# Patient Record
Sex: Female | Born: 1935 | Race: Black or African American | Hispanic: No | Smoking: Never smoker
Health system: Southern US, Community
[De-identification: ages and names within clinical notes are randomized; demographics above are authoritative.]

## PROBLEM LIST (undated history)

## (undated) DIAGNOSIS — H9190 Unspecified hearing loss, unspecified ear: Secondary | ICD-10-CM

## (undated) DIAGNOSIS — F0390 Unspecified dementia without behavioral disturbance: Secondary | ICD-10-CM

## (undated) DIAGNOSIS — I10 Essential (primary) hypertension: Secondary | ICD-10-CM

## (undated) DIAGNOSIS — D649 Anemia, unspecified: Secondary | ICD-10-CM

## (undated) DIAGNOSIS — M6281 Muscle weakness (generalized): Secondary | ICD-10-CM

## (undated) HISTORY — PX: HIP FRACTURE SURGERY: SHX118

---

## 1997-08-08 ENCOUNTER — Other Ambulatory Visit: Admission: RE | Admit: 1997-08-08 | Discharge: 1997-08-08 | Payer: Self-pay | Admitting: Internal Medicine

## 1997-11-01 ENCOUNTER — Other Ambulatory Visit: Admission: RE | Admit: 1997-11-01 | Discharge: 1997-11-01 | Payer: Self-pay | Admitting: Internal Medicine

## 2000-04-14 ENCOUNTER — Encounter: Payer: Self-pay | Admitting: Internal Medicine

## 2000-04-14 ENCOUNTER — Ambulatory Visit (HOSPITAL_COMMUNITY): Admission: RE | Admit: 2000-04-14 | Discharge: 2000-04-14 | Payer: Self-pay | Admitting: Internal Medicine

## 2006-01-16 ENCOUNTER — Encounter: Payer: Self-pay | Admitting: Vascular Surgery

## 2006-01-16 ENCOUNTER — Ambulatory Visit (HOSPITAL_COMMUNITY): Admission: RE | Admit: 2006-01-16 | Discharge: 2006-01-16 | Payer: Self-pay | Admitting: Internal Medicine

## 2006-03-26 ENCOUNTER — Emergency Department (HOSPITAL_COMMUNITY): Admission: EM | Admit: 2006-03-26 | Discharge: 2006-03-26 | Payer: Self-pay | Admitting: Emergency Medicine

## 2006-04-11 ENCOUNTER — Encounter: Payer: Self-pay | Admitting: Orthopedic Surgery

## 2006-04-11 ENCOUNTER — Inpatient Hospital Stay (HOSPITAL_COMMUNITY): Admission: EM | Admit: 2006-04-11 | Discharge: 2006-04-17 | Payer: Self-pay | Admitting: Emergency Medicine

## 2007-07-01 IMAGING — CR DG HIP 1V PORT*R*
1 series · 1 of 1 positions shown · non-contrast
Comparison: none

CLINICAL DATA: [AGE]; right hip fracture.
 PORTABLE RIGHT HIP ? 1 VIEW ? 04/12/06: 
 Single portable lateral view of the right hip.

[view not recorded]
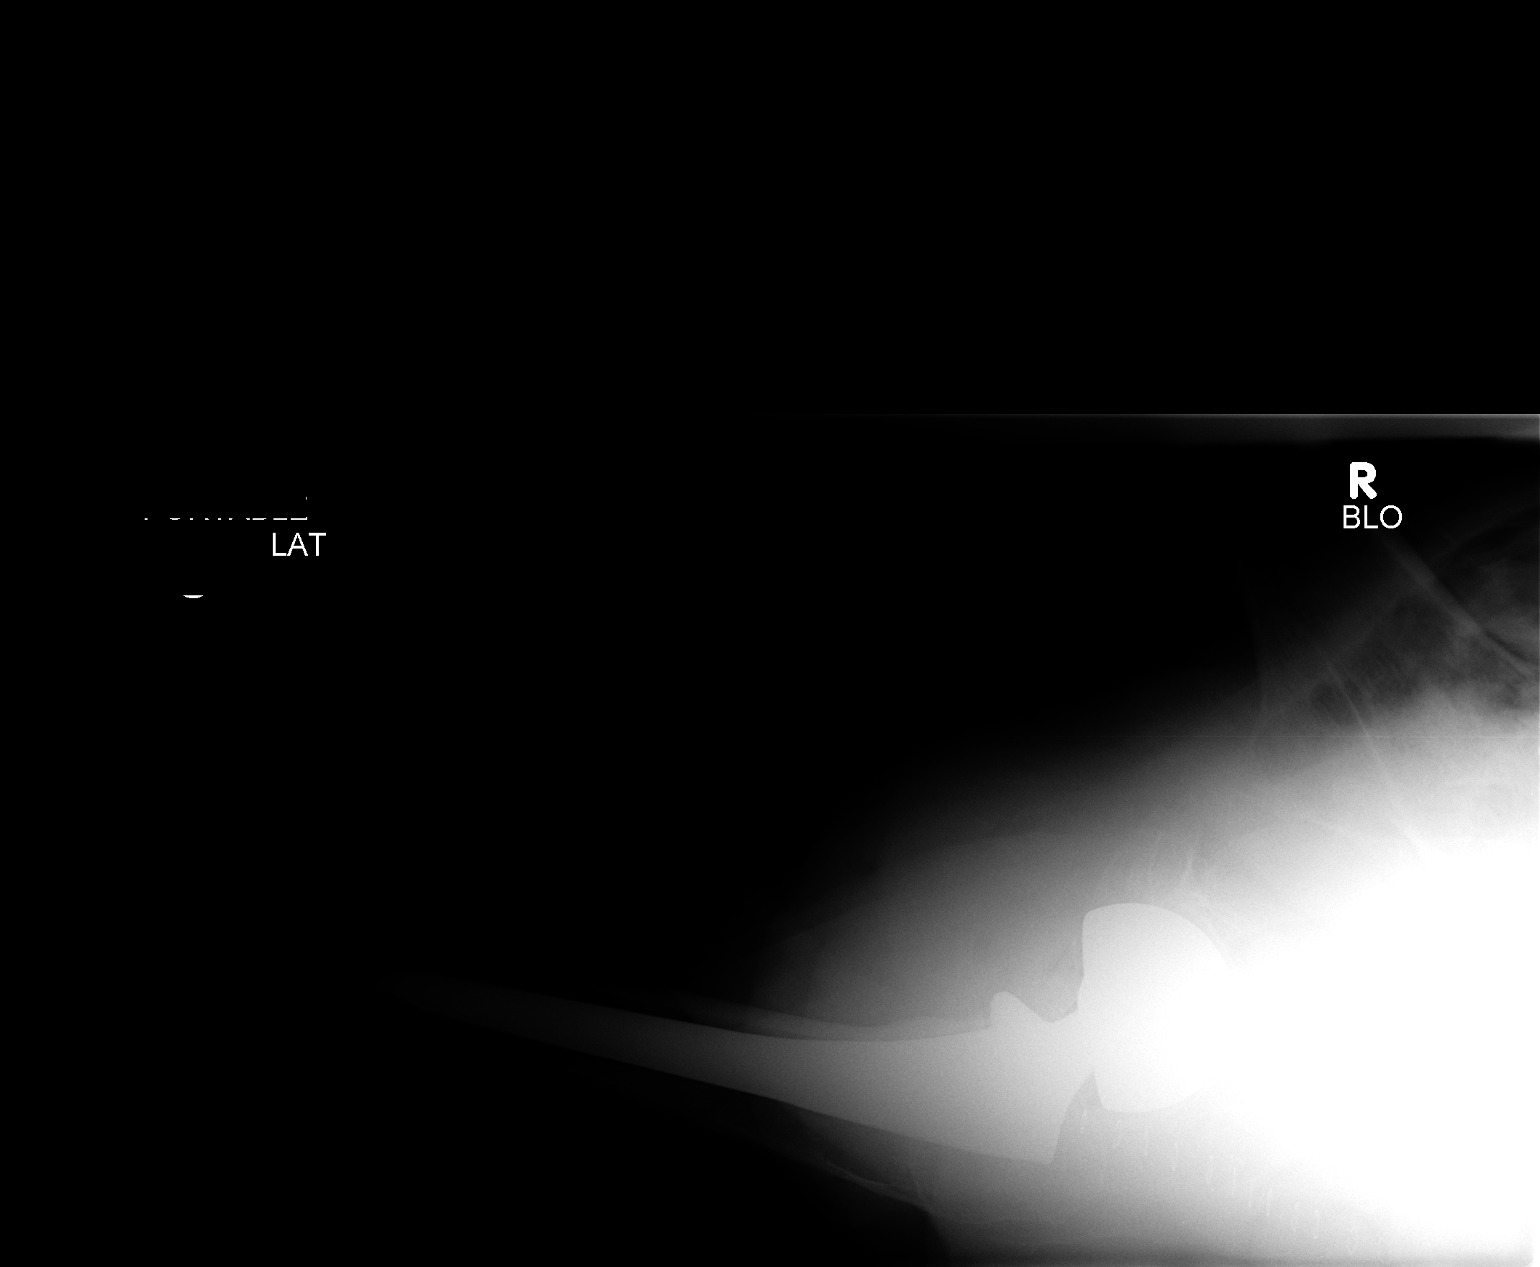

[1 of 1 positions shown; findings below may reference images not displayed]

FINDINGS: There is a bipolar hip prosthesis in place.  The study is limited but no gross complicating features are demonstrated.
IMPRESSION: Bipolar hip prosthesis in good position.  No definite complicating features but the exam is somewhat limited.

## 2008-09-25 ENCOUNTER — Emergency Department (HOSPITAL_COMMUNITY): Admission: EM | Admit: 2008-09-25 | Discharge: 2008-09-25 | Payer: Self-pay | Admitting: Emergency Medicine

## 2010-08-07 LAB — POCT I-STAT, CHEM 8
BUN: 16 mg/dL (ref 6–23)
Calcium, Ion: 1.15 mmol/L (ref 1.12–1.32)
Creatinine, Ser: 1.1 mg/dL (ref 0.4–1.2)
Hemoglobin: 11.6 g/dL — ABNORMAL LOW (ref 12.0–15.0)
Sodium: 143 mEq/L (ref 135–145)

## 2010-08-13 ENCOUNTER — Encounter: Payer: Self-pay | Admitting: Internal Medicine

## 2010-08-13 DIAGNOSIS — E119 Type 2 diabetes mellitus without complications: Secondary | ICD-10-CM | POA: Insufficient documentation

## 2010-08-13 DIAGNOSIS — I10 Essential (primary) hypertension: Secondary | ICD-10-CM

## 2010-09-11 NOTE — Consult Note (Signed)
NAME:  Kara, [name] NO.:  [id number]   MEDICAL RECORD NO.:  [id number]          PATIENT TYPE:  EMS   LOCATION:  MAJO                         FACILITY:  [facility]   PHYSICIAN:  Kara [name], M.D.    DATE OF BIRTH:  1905/09/06   DATE OF CONSULTATION:  09/25/2008  DATE OF DISCHARGE:                                 CONSULTATION   HISTORY:  The patient is seen at the request of Dr. Weldon [name], the ER  physician for a probable food impaction.  He has tried some glucagon.  She had been eating chicken earlier and it would not go down.  She has  been constantly spitting.  She has never had this problem before and no  other previous swallowing problems.   PAST MEDICAL HISTORY:  Pertinent for arthritis, diabetes with a history  of a hiatal hernia as well, and high blood pressure.   PAST SURGICAL HISTORY:  Hip fracture only.   FAMILY HISTORY:  Noncontributory.   DRUG ALLERGIES:  None.   MEDICATIONS:  At home include Nasonex, Lasix, Nadolol, potassium,  Diovan, and aspirin.   REVIEW OF SYSTEMS:  Negative except above.   PHYSICAL EXAMINATION:  VITAL SIGNS:  Other than spitting into the cup,  vital signs stable and afebrile.  GENERAL:  She answers all questions appropriately.  LUNGS:  Clear.  HEART:  Regular rate and rhythm.  ABDOMEN:  Soft and nontender.   ASSESSMENT:  Obvious food impaction.   PLAN:  The risks, benefits, and methods of endoscopy were discussed with  both, the patient and the son.  We will proceed ASAP, with further  workup and plans depending on her findings.           ______________________________  Kara [name], M.D.     MEM/MEDQ  D:  09/25/2008  T:  09/25/2008  Job:  [id number]   cc:   Kara Gens P. Kara Inches, MD

## 2010-09-11 NOTE — Op Note (Signed)
NAME:  Kara [name], Kara [name] NO.:  [id number]   MEDICAL RECORD NO.:  [id number]          PATIENT TYPE:  EMS   LOCATION:  MAJO                         FACILITY:  [facility]   PHYSICIAN:  Petra Kuba, M.D.    DATE OF BIRTH:  03-16-1906   DATE OF PROCEDURE:  09/25/2008  DATE OF DISCHARGE:                               OPERATIVE REPORT   PROCEDURE:  EGD with food removal.   INDICATIONS:  Patient with obvious food impaction.  Consent was signed  after risks, benefits, methods, options thoroughly discussed with both  her and her son.   MEDICINES USED:  Fentanyl 25 mcg, Versed 2 mg.   PROCEDURE IN DETAIL:  The video endoscope was inserted by direct vision.  Obvious food, mostly liquid and vegetable matter was seen in her of mid  esophagus.  She did have a little bit of inflammation.  There was no  motility in the esophagus and we could advance the scope around the food  into the distal esophagus, which did have a fibrous ring and with  minimal resistance able to advance into the stomach.  There was no food  obstructing the ring.  There was some fluid in the stomach, which was  suctioned.  We went ahead and withdrew back to the mid esophagus where  the food was and first using the [facility] retrieval net removed 1 large  piece of vegetable matter.  We then connected the Lucina Mellow net to function.  Once the food was grabbed in the [place] net both the [place] net and the  scope were removed and the piece was recovered.  We reinserted the  scope.  We grabbed another piece with the four-prong grabber and the  scope were removed.  We then elevated the head off her bed after we  reinserted the scope and washed the remaining part of the food and water  into the stomach.  We then advanced into the stomach and some of the  fluid was suctioned.  We then slowly withdrew to her esophagus.  There  was some spasm, but no signs of bleeding, significant trauma, or any  residual food.  We could not completely  evaluate her stomach.  The scope  was removed.  The patient tolerated the procedure well.  There was no  obvious immediate complication.   ENDOSCOPIC DIAGNOSES:  1. Obvious food in her esophagus, although able to advance the scope      easily around it and into her stomach.  Some of the fluid was      suctioned.  2. Food removed with the Lucina Mellow net one time and the four-prong grabber      one time.  3. Small hiatal hernia with a small fibrous ring and increased spasm      of the esophagus.  4. Stomach not well evaluated.  Some of the fluid suctioned at the      beginning and at the end of the procedure.  5. No other abnormality seen in exam to the mid stomach as above.   PLAN:  Clear liquids for 24 hours.  We will  give her 2 weeks of Prilosec  OTC.  I will be happy to see her back p.r.n. if swallowing problems  continue.           ______________________________  Petra Kuba, M.D.     MEM/MEDQ  D:  09/25/2008  T:  09/25/2008  Job:  [id number]   cc:   Dr. Margaretmary Bayley  Donia Guiles, M.D.

## 2010-09-14 NOTE — H&P (Signed)
NAME:  Kara [name], Kara [name] NO.:  [id number]   MEDICAL RECORD NO.:  [id number]            PATIENT TYPE:   LOCATION:                                 FACILITY:   PHYSICIAN:  Myrtie Neither, MD           DATE OF BIRTH:   DATE OF ADMISSION:  04/11/2006  DATE OF DISCHARGE:                              HISTORY & PHYSICAL   CHIEF COMPLAINT:  Painful right hip secondary to a fall.   HISTORY OF PRESENT ILLNESS:  This is a 77 year old female who gives a  history of a fall approximately 2-3 weeks ago.  The patient was seen by  her primary care doctor, and had x-rays which did not show any fracture.  The patient continued to have some symptoms in the right hip.  The  patient today, at home, while in the bathroom was maneuvering herself  about with a walker, and had put pressure on her left knee and this slid  out from under her causing her to fall and she developed severe right  hip pain, unable to get up off the floor and was brought to the office  by both the daughter and the son.  The patient was sent to [facility] for x-rays and found to have a femoral neck fracture of the  right hip and admitted.   PAST MEDICAL HISTORY:  Is that of high blood pressure.  No previous  hospitalization.  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   ALLERGIES:  None known.   MEDICATIONS:  1. Darvocet-N 100.  2. Lyrica 75 mg.  3. __________ 20 mg daily.  4. Lasix 40 mg daily.  5. Cozaar 100 mg daily.  6. Potassium 1 tab daily.   FAMILY HISTORY:  High blood pressure and diabetes.   SOCIAL HISTORY:  Negative with no history of use of alcohol or tobacco.   REVIEW OF SYSTEMS:  The patient has had some symptoms of vaginal  cystitis. CARDIORESPIRATORY:  No urinary symptoms.  No bowel symptoms.   PHYSICAL EXAMINATION:  GENERAL:  Alert and oriented with some  distress  with pain in the right hip.  Sitting in a wheelchair.  VITAL SIGNS:  Temperature 98.5, blood pressure 138/64, pulse 59,  respirations 20.  O2 saturation 95%.  HEENT:  Head normocephalic.  Eyes conjunctivae are clear.  NECK:  Supple.  CHEST:  Clear.  CARDIOVASCULAR:  Regular rate and rhythm.  EXTREMITIES:  Right hip tender AP and laterally, internally rotated,  shortened.  Neurovascular status is intact. __________ Intact.   X-RAYS:  Revealed femoral neck fracture right hip with displacement.   IMPRESSION:  1. Femoral neck fracture right hip.  2. Hypertension.  3. Fall.   PLAN:  __________ right hip replacement.      Myrtie Neither, MD  Electronically Signed     AC/MEDQ  D:  04/12/2006  T:  04/12/2006  Job:  [id number]

## 2010-09-14 NOTE — Op Note (Signed)
NAME:  Kara, [name] NO.:  [id number]   MEDICAL RECORD NO.:  [id number]          PATIENT TYPE:  INP   LOCATION:  1503                         FACILITY:  [facility]   PHYSICIAN:  Myrtie Neither, MD      DATE OF BIRTH:  Mar 09, 1906   DATE OF PROCEDURE:  04/12/2006  DATE OF DISCHARGE:                               OPERATIVE REPORT   PREOPERATIVE DIAGNOSIS:  Right femoral neck fracture.   POSTOPERATIVE DIAGNOSIS:  Right femoral neck fracture.   ANESTHESIA:  General.   PROCEDURE:  Right bipolar hip replacement; DePuy bipolar hip.   DESCRIPTION OF PROCEDURE:  The patient was taken to the operating room  after given adequate preop medications given.  General anesthesia was  intubated.  The patient was placed in the right lateral position.  The  right hip was prepped with DuraPrep and draped in sterile manner.  Bovie  used for hemostasis.  Posterior southern approach was made on the right  hip; went through the skin and subcutaneous tissue, down to the fascia  lata and gluteal muscle.  Short rotators were released.  Capsule was  incised.  Hematoma was evacuated.  Femoral neck fracture was identified  and femoral head removed.  Sizing of the femoral head was that of 47 mm.  Femoral neck cut was done, followed by rasping down the femoral canal.  After adequate rasping, which was up to a size 5 rasp, Calcar cutter was  then used to rasp and sent down the canal quite well with no toggle;  good stable bone fit with good bone contact.  Trial femoral head of 1.5  28 mm with a 47 mm femoral bipolar head, and a size 5 Press-Fit stem was  put in place.  Trial component was found to be very good and stable with  flexion, full extension, good internal and external rotation with no  subluxation.  Next, the following trial components were removed and  final implant was that of a size 5 PressFit stem, with a bipolar head at  47 mm, and femoral head articular components 28 mm with a 1.5  __________  .  The hip was then reduced.  Range of motion again tested; full  flexion, good extension, good internal and external rotation, no  subluxation. Copious irrigation was then done, followed by wound closure  with 0 Vicryl for the fascia, 2-0 for the subcutaneous and skin staples.  Compressive dressing was applied.  Hip abduction pillow was applied.  The patient tolerated the procedure quite well, taken to recovery room  in stable and satisfactory condition -- with minimal blood loss.      Myrtie Neither, MD  Electronically Signed     AC/MEDQ  D:  04/12/2006  T:  04/12/2006  Job:  [id number]

## 2010-09-14 NOTE — Discharge Summary (Signed)
NAME:  Kara [name], Kara [name] NO.:  [id number]   MEDICAL RECORD NO.:  [id number]          PATIENT TYPE:  INP   LOCATION:  1503                         FACILITY:  [facility]   PHYSICIAN:  Myrtie Neither, MD      DATE OF BIRTH:  [birth year]   DATE OF ADMISSION:  04/11/2006  DATE OF DISCHARGE:  04/17/2006                               DISCHARGE SUMMARY   ADMITTING DIAGNOSES:  Right femoral neck fracture, history of  hypertension, history of fall, history of neuropathy, right foot.   DISCHARGE DIAGNOSES:  Right femoral neck fracture, history of  hypertension, history of fall, history of neuropathy, right foot, and  with protruding hemorrhoids, anemia secondary to surgical blood loss.   COMPLICATIONS:  None.   INFECTIONS:  None.   OPERATION:  Right bipolar hip replacement.   PERTINENT HISTORY:  This is a 77 year old female who had fallen at home  after trying to catch herself with her left hand.  Patient was seen and  found to have femoral neck fracture of the right hip, tender, internal  rotated, shortened.  Neurovascular status intact.  X-ray revealed right  femoral neck fracture.   HOSPITAL COURSE:  The patient underwent preop laboratory, CBC, UA, EKG,  chest x-ray, CMET, PT, PTT, and platelet count, which were all found to  be normal and stable enough for patient to undergo surgery.  Patient  underwent bipolar hip replacement on April 12, 2006.  Tolerated  procedure quite well.  Postop course was fairly benign.  Patient did  receive 2 units of packed cells due to anemia secondary to surgical  blood loss.  Patient also has had a problem with external hemorrhoids,  being presently treated with Premarin vaginal cream nightly.  Patient is  also on doxycycline 100 mg p.o. daily x7 days.  Patient is progressing  with physical therapy, weightbearing as tolerated on the right side.  Patient is being seen also by Dr. __________.  Patient is ready to be  discharged to nursing  home.   DISCHARGE MEDICATIONS:  1. Premarin vaginal cream 1 g inserted nightly.  2. Doxycycline 100 mg p.o. daily x7 days.  3. Coumadin 4 mg daily INRs.  Daily dressing change right hip.  4. Colace 100 mg b.i.d.  5. Lyrica 75 mg nightly.  6. Aspirin 81 mg daily.  7. Ferrous sulfate 324 mg b.i.d.  8. Toprol 25 mg daily.  9. Megace 40 mg daily.  10.Cozaar 100 mg daily.  11.Patient is also on Xalatan eye drop, 1 drop 0.005% nightly.  12.Timolol eye drop 1 drop b.i.d. 0.5%.  13.Lasix 20 mg daily.  14.Tylenol 2 q.4 p.r.n.  15.Hydrocortisone or Anusol suppository p.r.n.  16.Darvocet-N 100 one q.4 p.r.n. for pain.   Patient being discharged in stable and satisfactory condition.  Patient  to return to the office in 2-week period.      Myrtie Neither, MD  Electronically Signed     AC/MEDQ  D:  04/17/2006  T:  04/17/2006  Job:  [id number]

## 2011-08-22 ENCOUNTER — Emergency Department (HOSPITAL_COMMUNITY)
Admission: EM | Admit: 2011-08-22 | Discharge: 2011-08-22 | Disposition: A | Discharge status: Home / Self Care | Payer: Medicare Other | Attending: Emergency Medicine | Admitting: Emergency Medicine

## 2011-08-22 ENCOUNTER — Encounter (HOSPITAL_COMMUNITY): Payer: Self-pay | Admitting: Emergency Medicine

## 2011-08-22 DIAGNOSIS — E119 Type 2 diabetes mellitus without complications: Secondary | ICD-10-CM | POA: Insufficient documentation

## 2011-08-22 DIAGNOSIS — R112 Nausea with vomiting, unspecified: Secondary | ICD-10-CM | POA: Insufficient documentation

## 2011-08-22 DIAGNOSIS — R110 Nausea: Secondary | ICD-10-CM

## 2011-08-22 DIAGNOSIS — I10 Essential (primary) hypertension: Secondary | ICD-10-CM | POA: Insufficient documentation

## 2011-08-22 DIAGNOSIS — F0390 Unspecified dementia without behavioral disturbance: Secondary | ICD-10-CM | POA: Insufficient documentation

## 2011-08-22 DIAGNOSIS — N390 Urinary tract infection, site not specified: Secondary | ICD-10-CM

## 2011-08-22 HISTORY — DX: Essential (primary) hypertension: I10

## 2011-08-22 HISTORY — DX: Unspecified dementia, unspecified severity, without behavioral disturbance, psychotic disturbance, mood disturbance, and anxiety: F03.90

## 2011-08-22 LAB — POCT I-STAT, CHEM 8
Glucose, Bld: 95 mg/dL (ref 70–99)
Potassium: 4.4 mEq/L (ref 3.5–5.1)
Sodium: 147 mEq/L — ABNORMAL HIGH (ref 135–145)
TCO2: 26 mmol/L (ref 0–100)

## 2011-08-22 LAB — URINALYSIS, ROUTINE W REFLEX MICROSCOPIC
Ketones, ur: 15 mg/dL — AB
Protein, ur: NEGATIVE mg/dL

## 2011-08-22 LAB — URINE MICROSCOPIC-ADD ON

## 2011-08-22 MED ORDER — ONDANSETRON 4 MG PO TBDP: 4.0000 mg | ORAL_TABLET | Freq: Three times a day (TID) | ORAL | Status: AC | PRN

## 2011-08-22 MED ORDER — CEPHALEXIN 500 MG PO CAPS: 500.0000 mg | ORAL_CAPSULE | Freq: Four times a day (QID) | ORAL | Status: AC

## 2011-08-22 NOTE — ED Notes (Signed)
Son states pt has panic attack when he goes out to see a girl. Normally she gets mad or angry but tonight she stated she started to vomit, unwitnessed. No distress at present. [place], [place] M

## 2011-08-22 NOTE — ED Notes (Signed)
Attempt to cath unable to to cath .told family member to lets [place] know when pt. Has to void

## 2011-08-22 NOTE — ED Provider Notes (Signed)
  I performed a history and physical examination of Kara [name] and discussed her management with Pascal Lux Wingen PA-C.  I agree with the history, physical, assessment, and plan of care, with the following exceptions: None  The patient is 78 years old and presents currently in no distress denying any symptoms, but having called her son earlier this morning reporting that she felt nauseated. She nor her son reports any actual vomiting, and she denies any abdominal pain at that time or presently. She denies any fever or chills, and denies any diarrhea or abdominal bloating.  On examination the patient is awake, alert, oriented appropriately to person, place, time, and event, is very hard of hearing but responds appropriately. Her skin is warm and well perfused but with poor turgor. Pupils are equal round reactive to light appropriately with extraocular movements intact no apparent cranial nerve deficits, no scleral icterus, but with dry mucous membranes.  She is in no respiratory distress with no jugular vein distention, regular heart rate and rhythm with occasional ectopy, otherwise stable vital signs. Normal respiratory effort with no respiratory distress, and clear lung sounds in all fields without wheezes, rales, or rhonchi. Her abdomen is nondistended, soft, nontender to palpation, without any masses, rebound tenderness, or guarding.  The patient appears to be in no distress with an exam notable only for mild dehydration which we will renew the 8 with oral fluid administration. I do not suspect severe or acute illness in this patient, with minimal reported symptoms that are currently not present. We will assess an electrocardiogram and a troponin to assure no occult myocardial infarction is in progress, and we will assess hemoglobin, hematocrit to look for anemia, and electrolytes to evaluate for electrolyte abnormality. If no significant findings, the patient will be able to be discharged home, and  I would recommend a prescription of Zofran so that she may have it on hand if nausea returns.  [name] and I have spoken about the patient's care and call aggravated together on her treatment plan and diagnostic plan.  I was present for the following procedures: None Time Spent in Critical Care of the patient: None Time spent in discussions with the patient and family: 10 minutes.  Manus Rudd, MD 08/22/11 [phone]

## 2011-08-22 NOTE — ED Notes (Signed)
PT. REPORTS  NAUSEA AND VOMITTING WITH GENERALIZED WEAKNESS LAST NIGHT AFTER EATING SUPPER.

## 2011-08-22 NOTE — ED Provider Notes (Signed)
History     CSN: [id number]  Arrival date & time 08/22/11  4098   First MD Initiated Contact with Patient 08/22/11 [phone]      Chief Complaint  Patient presents with  . Emesis    (Consider location/radiation/quality/duration/timing/severity/associated sxs/prior treatment) HPI Comments: Patient comes in today with a chief complaint of nausea.  Patient reports that she had some indigestion last evening after eating dinner.  She reports that she then had a couple episodes of vomiting undigested food throughout the night.  Patient currently lives with her son.  Son states that he did not observe any vomiting.  Patient denies any nausea at this time and reports that her symptoms have completely resolved.  She did not take any medication for her symptoms.  She reports that she does not have abdominal pain at this time and did not ever have any abdominal pain with this.  Denies chest pain.  Denies SOB.  Denies any blood in her emesis or stool.  Denies any fevers.  Denies any diarrhea or constipation.   Her son reports that she becomes jealous when he leaves the home and will call him frequently when he leaves and tell him that he needs to return because she feels sick.  Son did not witness any vomiting.  The history is provided by the patient (son).    Past Medical History  Diagnosis Date  . Dementia   . Hypertension   . Diabetes mellitus     Past Surgical History  Procedure Date  . Hip fracture surgery     No family history on file.  History  Substance Use Topics  . Smoking status: Never Smoker   . Smokeless tobacco: Not on file  . Alcohol Use: No    OB History    Grav Para Term Preterm Abortions TAB SAB Ect Mult Living                  Review of Systems  Constitutional: Negative for fever and chills.  Respiratory: Negative for shortness of breath.   Cardiovascular: Negative for chest pain.  Gastrointestinal: Positive for nausea and vomiting. Negative for abdominal pain,  diarrhea, constipation, blood in stool and abdominal distention.  Genitourinary: Negative for dysuria and hematuria.  Neurological: Negative for dizziness, syncope and light-headedness.    Allergies  Ditropan xl and Miralax  Home Medications   Current Outpatient Rx  Name Route Sig Dispense Refill  . CALCIUM CARBONATE ANTACID 500 MG PO CHEW Oral Chew 1 tablet by mouth daily. indigestion    . ASPIRIN 81 MG PO TBEC Oral Take 81 mg by mouth daily.      Marland Kitchen HYDROCHLOROTHIAZIDE 25 MG PO TABS Oral Take 25 mg by mouth daily.      Marland Kitchen NADOLOL 20 MG PO TABS Oral Take 20 mg by mouth daily.      Marland Kitchen POTASSIUM CHLORIDE 10 MEQ PO TBCR Oral Take 10 mEq by mouth daily.      Marland Kitchen VALSARTAN 160 MG PO TABS Oral Take 160 mg by mouth daily.        BP 143/68  Pulse 64  Temp(Src) 97.3 F (36.3 C) (Oral)  Resp 18  SpO2 100%  Physical Exam  Nursing note and vitals reviewed. Constitutional: She appears well-developed and well-nourished.  HENT:  Head: Normocephalic and atraumatic.  Nose: Nose normal.  Mouth/Throat: Uvula is midline. Mucous membranes are dry.  Eyes: EOM are normal. Pupils are equal, round, and reactive to light.  Neck: Normal range  of motion.  Cardiovascular: Normal rate, regular rhythm and normal heart sounds.   Pulmonary/Chest: Effort normal and breath sounds normal.  Abdominal: Soft. Bowel sounds are normal. She exhibits no distension and no mass. There is no tenderness. There is no rebound and no guarding.  Neurological: She is alert.  Skin: Skin is warm and dry. No rash noted.  Psychiatric: She has a normal mood and affect.    ED Course  Procedures (including critical care time)  Labs Reviewed - No data to display No results found.   No diagnosis found.  9:33 AM Reassessed patient.  She reports that she is comfortable at this time.  Denies nausea.  Will po challenge.  UA pending.   Date: 08/22/2011  Rate: 60  Rhythm:   QRS Axis: left  Intervals: normal  ST/T Wave  abnormalities: normal  Conduction Disutrbances:none  Narrative Interpretation:   Old EKG Reviewed: none available    MDM  Patient with nausea and possible vomiting that had resolved completely upon arrival in the ED.  No abdominal pain on exam.  Troponin negative, no ischemic changes on EKG. Do to the fact that she does not have any abdominal pain think that mesenteric ischemia, SBO, or other causes of surgical abdomen is very unlikely.  UA positive for UTI.  Patient given Keflex prescription and Zofran prescription.  Patient able to tolerate po liquids.        Pascal Lux Melmore, PA-C 08/22/11 [phone]

## 2011-08-22 NOTE — Discharge Instructions (Signed)
Take antibiotics as directed for Urinary Tract Infection. Make sure you are drinking plenty of fluids Take Zofran as needed for nausea.

## 2011-08-24 NOTE — ED Provider Notes (Signed)
Evaluation and management procedures were performed by the PA/NP/resident physician under my supervision/collaboration.   Goldie Tregoning D Dawson Hollman, MD 08/24/11 2052 

## 2012-08-20 ENCOUNTER — Other Ambulatory Visit: Payer: Self-pay | Admitting: Internal Medicine

## 2012-08-20 DIAGNOSIS — F09 Unspecified mental disorder due to known physiological condition: Secondary | ICD-10-CM

## 2012-08-25 ENCOUNTER — Ambulatory Visit (HOSPITAL_COMMUNITY)
Admission: RE | Admit: 2012-08-25 | Discharge: 2012-08-25 | Disposition: A | Discharge status: Home / Self Care | Payer: Medicare Other | Source: Ambulatory Visit | Attending: Internal Medicine | Admitting: Internal Medicine

## 2012-08-25 DIAGNOSIS — E119 Type 2 diabetes mellitus without complications: Secondary | ICD-10-CM | POA: Insufficient documentation

## 2012-08-25 DIAGNOSIS — I10 Essential (primary) hypertension: Secondary | ICD-10-CM | POA: Insufficient documentation

## 2012-08-25 DIAGNOSIS — F09 Unspecified mental disorder due to known physiological condition: Secondary | ICD-10-CM

## 2012-08-25 DIAGNOSIS — F29 Unspecified psychosis not due to a substance or known physiological condition: Secondary | ICD-10-CM | POA: Insufficient documentation

## 2013-02-06 ENCOUNTER — Encounter (HOSPITAL_COMMUNITY): Payer: Self-pay | Admitting: Emergency Medicine

## 2013-02-06 DIAGNOSIS — Z9181 History of falling: Secondary | ICD-10-CM

## 2013-02-06 DIAGNOSIS — F0390 Unspecified dementia without behavioral disturbance: Secondary | ICD-10-CM | POA: Diagnosis present

## 2013-02-06 DIAGNOSIS — E860 Dehydration: Secondary | ICD-10-CM | POA: Diagnosis present

## 2013-02-06 DIAGNOSIS — Z79899 Other long term (current) drug therapy: Secondary | ICD-10-CM

## 2013-02-06 DIAGNOSIS — N179 Acute kidney failure, unspecified: Secondary | ICD-10-CM | POA: Diagnosis present

## 2013-02-06 DIAGNOSIS — I498 Other specified cardiac arrhythmias: Secondary | ICD-10-CM | POA: Diagnosis present

## 2013-02-06 DIAGNOSIS — Z7982 Long term (current) use of aspirin: Secondary | ICD-10-CM

## 2013-02-06 DIAGNOSIS — D638 Anemia in other chronic diseases classified elsewhere: Secondary | ICD-10-CM | POA: Diagnosis present

## 2013-02-06 DIAGNOSIS — Z602 Problems related to living alone: Secondary | ICD-10-CM

## 2013-02-06 DIAGNOSIS — H9190 Unspecified hearing loss, unspecified ear: Secondary | ICD-10-CM | POA: Diagnosis present

## 2013-02-06 DIAGNOSIS — E119 Type 2 diabetes mellitus without complications: Secondary | ICD-10-CM | POA: Diagnosis present

## 2013-02-06 DIAGNOSIS — R5381 Other malaise: Secondary | ICD-10-CM | POA: Diagnosis present

## 2013-02-06 DIAGNOSIS — Z66 Do not resuscitate: Secondary | ICD-10-CM | POA: Diagnosis present

## 2013-02-06 DIAGNOSIS — I10 Essential (primary) hypertension: Secondary | ICD-10-CM | POA: Diagnosis present

## 2013-02-06 DIAGNOSIS — N390 Urinary tract infection, site not specified: Principal | ICD-10-CM | POA: Diagnosis present

## 2013-02-06 DIAGNOSIS — K5900 Constipation, unspecified: Secondary | ICD-10-CM | POA: Diagnosis present

## 2013-02-06 LAB — COMPREHENSIVE METABOLIC PANEL
ALT: 15 U/L (ref 0–35)
Alkaline Phosphatase: 37 U/L — ABNORMAL LOW (ref 39–117)
Calcium: 8.9 mg/dL (ref 8.4–10.5)
Creatinine, Ser: 1.31 mg/dL — ABNORMAL HIGH (ref 0.50–1.10)
GFR calc Af Amer: 36 mL/min — ABNORMAL LOW (ref >90)
GFR calc non Af Amer: 31 mL/min — ABNORMAL LOW (ref >90)
Potassium: 4.7 mEq/L (ref 3.5–5.1)
Total Bilirubin: 0.6 mg/dL (ref 0.3–1.2)
Total Protein: 6.9 g/dL (ref 6.0–8.3)

## 2013-02-06 LAB — CBC
HCT: 27.8 % — ABNORMAL LOW (ref 36.0–46.0)
MCH: 30.2 pg (ref 26.0–34.0)
MCHC: 33.1 g/dL (ref 30.0–36.0)

## 2013-02-06 NOTE — ED Notes (Signed)
Pt c/o right sided hip pain after two fall today. Pt normally walks with a walker. And family states her equilibrium is off.

## 2013-02-07 ENCOUNTER — Encounter (HOSPITAL_COMMUNITY): Payer: Self-pay | Admitting: Internal Medicine

## 2013-02-07 ENCOUNTER — Emergency Department (HOSPITAL_COMMUNITY): Payer: Medicare Other

## 2013-02-07 ENCOUNTER — Inpatient Hospital Stay (HOSPITAL_COMMUNITY)
Admission: EM | Admit: 2013-02-07 | Discharge: 2013-02-10 | DRG: 690 | Disposition: A | Discharge status: Skilled Nursing Facility | Payer: Medicare Other | Attending: Internal Medicine | Admitting: Internal Medicine

## 2013-02-07 DIAGNOSIS — D649 Anemia, unspecified: Secondary | ICD-10-CM | POA: Diagnosis present

## 2013-02-07 DIAGNOSIS — W19XXXA Unspecified fall, initial encounter: Secondary | ICD-10-CM

## 2013-02-07 DIAGNOSIS — W19XXXD Unspecified fall, subsequent encounter: Secondary | ICD-10-CM

## 2013-02-07 DIAGNOSIS — R296 Repeated falls: Secondary | ICD-10-CM

## 2013-02-07 DIAGNOSIS — E860 Dehydration: Secondary | ICD-10-CM | POA: Diagnosis present

## 2013-02-07 DIAGNOSIS — I10 Essential (primary) hypertension: Secondary | ICD-10-CM | POA: Diagnosis present

## 2013-02-07 DIAGNOSIS — N179 Acute kidney failure, unspecified: Secondary | ICD-10-CM

## 2013-02-07 DIAGNOSIS — N390 Urinary tract infection, site not specified: Principal | ICD-10-CM | POA: Diagnosis present

## 2013-02-07 DIAGNOSIS — F0390 Unspecified dementia without behavioral disturbance: Secondary | ICD-10-CM

## 2013-02-07 DIAGNOSIS — E119 Type 2 diabetes mellitus without complications: Secondary | ICD-10-CM

## 2013-02-07 LAB — CBC
MCH: 29.8 pg (ref 26.0–34.0)
MCHC: 32.9 g/dL (ref 30.0–36.0)
MCV: 90.7 fL (ref 78.0–100.0)
Platelets: 213 10*3/uL (ref 150–400)
RDW: 14.9 % (ref 11.5–15.5)
WBC: 6.2 10*3/uL (ref 4.0–10.5)

## 2013-02-07 LAB — BASIC METABOLIC PANEL
CO2: 25 mEq/L (ref 19–32)
Calcium: 8.9 mg/dL (ref 8.4–10.5)
Creatinine, Ser: 1.2 mg/dL — ABNORMAL HIGH (ref 0.50–1.10)
GFR calc non Af Amer: 35 mL/min — ABNORMAL LOW (ref >90)
Glucose, Bld: 87 mg/dL (ref 70–99)
Sodium: 140 mEq/L (ref 135–145)

## 2013-02-07 LAB — URINALYSIS, ROUTINE W REFLEX MICROSCOPIC
Glucose, UA: NEGATIVE mg/dL
Nitrite: NEGATIVE
Protein, ur: NEGATIVE mg/dL
Urobilinogen, UA: 0.2 mg/dL (ref 0.0–1.0)
pH: 6.5 (ref 5.0–8.0)

## 2013-02-07 LAB — URINE MICROSCOPIC-ADD ON

## 2013-02-07 LAB — POCT I-STAT TROPONIN I: Troponin i, poc: 0.01 ng/mL (ref 0.00–0.08)

## 2013-02-07 LAB — TSH: TSH: 1.573 u[IU]/mL (ref 0.350–4.500)

## 2013-02-07 MED ORDER — ACETAMINOPHEN 650 MG RE SUPP: 650.0000 mg | Freq: Four times a day (QID) | RECTAL | Status: DC | PRN

## 2013-02-07 MED ORDER — HYDRALAZINE HCL 20 MG/ML IJ SOLN: 10.0000 mg | INTRAMUSCULAR | Status: DC | PRN

## 2013-02-07 MED ORDER — SODIUM CHLORIDE 0.9 % IV BOLUS (SEPSIS)
1000.0000 mL | Freq: Once | INTRAVENOUS | Status: AC
  Administered 2013-02-07: 1000 mL via INTRAVENOUS

## 2013-02-07 MED ORDER — ONDANSETRON HCL 4 MG PO TABS: 4.0000 mg | ORAL_TABLET | Freq: Four times a day (QID) | ORAL | Status: DC | PRN

## 2013-02-07 MED ORDER — HYDRALAZINE HCL 25 MG PO TABS
25.0000 mg | ORAL_TABLET | Freq: Three times a day (TID) | ORAL | Status: DC
  Administered 2013-02-07 (×2): 25 mg via ORAL
  Filled 2013-02-07 (×9): qty 1

## 2013-02-07 MED ORDER — IRBESARTAN 75 MG PO TABS
75.0000 mg | ORAL_TABLET | Freq: Every day | ORAL | Status: DC
  Administered 2013-02-07 – 2013-02-10 (×4): 75 mg via ORAL
  Filled 2013-02-07 (×4): qty 1

## 2013-02-07 MED ORDER — NADOLOL 20 MG PO TABS
20.0000 mg | ORAL_TABLET | Freq: Every day | ORAL | Status: DC
  Filled 2013-02-07: qty 1

## 2013-02-07 MED ORDER — SODIUM CHLORIDE 0.9 % IJ SOLN
3.0000 mL | Freq: Two times a day (BID) | INTRAMUSCULAR | Status: DC
  Administered 2013-02-07 – 2013-02-09 (×4): 3 mL via INTRAVENOUS

## 2013-02-07 MED ORDER — TAB-A-VITE/IRON PO TABS
1.0000 | ORAL_TABLET | ORAL | Status: DC
  Administered 2013-02-08 – 2013-02-10 (×2): 1 via ORAL
  Filled 2013-02-07 (×2): qty 1

## 2013-02-07 MED ORDER — ONDANSETRON HCL 4 MG/2ML IJ SOLN: 4.0000 mg | Freq: Four times a day (QID) | INTRAMUSCULAR | Status: DC | PRN

## 2013-02-07 MED ORDER — CEPHALEXIN 250 MG PO CAPS
500.0000 mg | ORAL_CAPSULE | Freq: Once | ORAL | Status: AC
  Administered 2013-02-07: 500 mg via ORAL
  Filled 2013-02-07: qty 2

## 2013-02-07 MED ORDER — IRBESARTAN 150 MG PO TABS
150.0000 mg | ORAL_TABLET | Freq: Every day | ORAL | Status: DC
  Filled 2013-02-07: qty 1

## 2013-02-07 MED ORDER — DEXTROSE 5 % IV SOLN
1.0000 g | INTRAVENOUS | Status: DC
  Administered 2013-02-07 – 2013-02-09 (×3): 1 g via INTRAVENOUS
  Filled 2013-02-07 (×5): qty 10

## 2013-02-07 MED ORDER — ACETAMINOPHEN 325 MG PO TABS: 650.0000 mg | ORAL_TABLET | Freq: Four times a day (QID) | ORAL | Status: DC | PRN

## 2013-02-07 NOTE — Plan of Care (Signed)
Problem: Phase I Progression Outcomes Goal: OOB as tolerated unless otherwise ordered Outcome: Completed/Met Date Met:  02/07/13 Pt OOB chair and to [facility], ambulating with RW and one assist to bathroom.

## 2013-02-07 NOTE — Progress Notes (Addendum)
[facility]                                                                                Patient Demographics  Kara [name], is a 79 y.o. female, DOB - Nov 16, 1905, ZOX:[id number]  Admit date - 02/07/2013   Admitting Physician Eduard Clos, MD  Outpatient Primary MD for the patient is Laurena Slimmer, MD  LOS - 0   Chief Complaint  Patient presents with  . Fall        Assessment & Plan    Dehydration - patient did receive 1 L normal saline in the ER. Hold all diuretics for now. Follow metabolic panel. She feels better, will monitor orthostatics, increase activity and have PT evaluate the patient. She continues to feel better.   Patient continues to be at risk for delirium due to advanced age and probably some senile dementia in the light of her age. Fall and aspiration precautions.     UTI - on ceftriaxone. Monitor urine culture.     AOCD - follow CBC.     Hypertension with sinus bradycardia - holding HCTZ due to dehydration, is continue beta blocker due to underlying sinus bradycardia, will place him on hydralazine along with home dose ARB and monitor. Telemetry monitoring check TSH.      Code Status: Full  Family Communication: none present   Disposition Plan: Home   Procedures    Consults    DVT Prophylaxis   SCDs   Lab Results  Component Value Date   PLT 213 02/07/2013    Medications  Scheduled Meds: . cefTRIAXone (ROCEPHIN)  IV  1 g Intravenous Q24H  . irbesartan  75 mg Oral Daily  . [START ON 02/08/2013] multivitamins with iron  1 tablet Oral Q M,W,F  . nadolol  20 mg Oral Daily  . sodium chloride  3 mL Intravenous Q12H   Continuous Infusions:  PRN Meds:.acetaminophen, acetaminophen, hydrALAZINE, ondansetron (ZOFRAN) IV, ondansetron  Antibiotics    Anti-infectives   Start     Dose/Rate Route Frequency Ordered Stop   02/07/13 0430  cefTRIAXone (ROCEPHIN) 1 g in dextrose 5 % 50 mL IVPB     1 g 100 mL/hr over 30  Minutes Intravenous Every 24 hours 02/07/13 0417     02/07/13 0245  cephALEXin (KEFLEX) capsule 500 mg     500 mg Oral  Once 02/07/13 0239 02/07/13 0306       Time Spent in minutes   35   SINGH,PRASHANT K M.D on 02/07/2013 at 9:16 AM  Between 7am to 7pm - Pager - [phone]  After 7pm go to [email or web address] - password [email or web address]  And look for the night coverage person covering for me after hours  [facility] Office  [phone]    Subjective:   Kara [name] today has, No headache, No chest pain, No abdominal pain - No Nausea, No new weakness tingling or numbness, No Cough - SOB.   Objective:   Filed Vitals:   02/07/13 0048 02/07/13 0115 02/07/13 0130 02/07/13 0453  BP: 118/54 118/47 109/56 130/62  Pulse: 60 57 52 57  Temp:    98.3 F (36.8 C)  TempSrc:  Oral  Resp:  22 24 20   Height:    5' (1.524 m)  Weight:    43.092 kg (95 lb)  SpO2: 100% 100% 97% 99%    Wt Readings from Last 3 Encounters:  02/07/13 43.092 kg (95 lb)    No intake or output data in the 24 hours ending 02/07/13 0916  Exam Awake Alert, Oriented X 3, No new F.N deficits, Normal affect [place].AT,PERRAL Supple Neck,No JVD, No cervical lymphadenopathy appriciated.  Symmetrical Chest wall movement, Good air movement bilaterally, CTAB RRR,No Gallops,Rubs or new Murmurs, No Parasternal Heave +ve B.Sounds, Abd Soft, Non tender, No organomegaly appriciated, No rebound - guarding or rigidity. No Cyanosis, Clubbing or edema, No new Rash or bruise    Data Review   Micro Results No results found for this or any previous visit (from the past 240 hour(s)).  Radiology Reports Dg Chest 1 View  02/07/2013   *RADIOLOGY REPORT*  Clinical Data: Status post fall; concern for chest injury.  CHEST - 1 VIEW  Comparison: Chest radiograph performed 09/25/2008  Findings: The lungs are well expanded.  Vascular congestion is noted.  No pleural effusion or pneumothorax is seen.  Vague left midlung density is  thought to reflect overlying soft tissues.  The cardiomediastinal silhouette is mildly enlarged.  No acute osseous abnormalities are identified.  IMPRESSION: Vascular congestion noted; lungs remain grossly clear. Displaced rib fractures seen.   Original Report Authenticated By: Tonia Ghent, M.D.   Dg Hip Complete Right  02/07/2013   *RADIOLOGY REPORT*  Clinical Data: Status post fall; right hip pain.  RIGHT HIP - COMPLETE 2+ VIEW  Comparison: Right hip radiographs performed 04/11/2006  Findings: There is no evidence of fracture or dislocation.  The patient's right hip arthroplasty appears grossly intact, without evidence of loosening.  The proximal right femur appears intact. Mild degenerative change is noted at the lower lumbar spine.  The sacroiliac joints are unremarkable in appearance.  The visualized bowel gas pattern is grossly unremarkable in appearance.  Scattered phleboliths are noted within the pelvis.  A calcified fibroid is noted.  IMPRESSION:  1.  No evidence of fracture or dislocation. 2.  Right hip arthroplasty appears grossly intact, without evidence of loosening.   Original Report Authenticated By: Tonia Ghent, M.D.    CBC  Recent Labs Lab 02/06/13 2210 02/07/13 0530  WBC 6.2 6.2  HGB 9.2* 9.6*  HCT 27.8* 29.2*  PLT 205 213  MCV 91.1 90.7  MCH 30.2 29.8  MCHC 33.1 32.9  RDW 14.9 14.9    Chemistries   Recent Labs Lab 02/06/13 2210 02/07/13 0530  NA 137 140  K 4.7 4.4  CL 105 107  CO2 24 25  GLUCOSE 79 87  BUN 28* 26*  CREATININE 1.31* 1.20*  CALCIUM 8.9 8.9  AST 34  --   ALT 15  --   ALKPHOS 37*  --   BILITOT 0.6  --    ------------------------------------------------------------------------------------------------------------------ estimated creatinine clearance is 14.8 ml/min (by C-G formula based on Cr of 1.2). ------------------------------------------------------------------------------------------------------------------ No results found for this  basename: HGBA1C,  in the last 72 hours ------------------------------------------------------------------------------------------------------------------ No results found for this basename: CHOL, HDL, LDLCALC, TRIG, CHOLHDL, LDLDIRECT,  in the last 72 hours ------------------------------------------------------------------------------------------------------------------ No results found for this basename: TSH, T4TOTAL, FREET3, T3FREE, THYROIDAB,  in the last 72 hours ------------------------------------------------------------------------------------------------------------------ No results found for this basename: VITAMINB12, FOLATE, FERRITIN, TIBC, IRON, RETICCTPCT,  in the last 72 hours  Coagulation profile No results found for this basename:  INR, PROTIME,  in the last 168 hours  No results found for this basename: DDIMER,  in the last 72 hours  Cardiac Enzymes No results found for this basename: CK, CKMB, TROPONINI, MYOGLOBIN,  in the last 168 hours ------------------------------------------------------------------------------------------------------------------ No components found with this basename: POCBNP,

## 2013-02-07 NOTE — Evaluation (Signed)
Physical Therapy Evaluation Patient Details Name: Kara [name] MRN: [id number] DOB: [birth year] Today's Date: 02/07/2013 Time: 4098-1191 PT Time Calculation (min): 16 min  PT Assessment / Plan / Recommendation History of Present Illness  Pt adm from home after fall.  Clinical Impression  Pt very HOH and with very poor vision making participation difficult.  Needs skilled PT to maximize I and safety to improve quality of life. Agree pt cannot manage at home and needs ST-SNF.    PT Assessment  Patient needs continued PT services    Follow Up Recommendations  SNF    Does the patient have the potential to tolerate intense rehabilitation      Barriers to Discharge        Equipment Recommendations  None recommended by PT    Recommendations for Other Services     Frequency Min 2X/week    Precautions / Restrictions Precautions Precautions: Fall   Pertinent Vitals/Pain See flow sheet.      Mobility  Bed Mobility Bed Mobility: Supine to Sit;Sitting - Scoot to Edge of Bed Supine to Sit: 2: Max assist Sitting - Scoot to [place] of Bed: 3: Mod assist Transfers Transfers: Sit to Stand;Stand to Sit Sit to Stand: 3: Mod assist;From bed Stand to Sit: 4: Min assist;To bed    Exercises     PT Diagnosis: Difficulty walking;Generalized weakness  PT Problem List: Decreased strength;Decreased activity tolerance;Decreased balance;Decreased mobility;Decreased knowledge of use of DME PT Treatment Interventions: DME instruction;Gait training;Functional mobility training;Therapeutic activities;Therapeutic exercise;Balance training;Patient/family education     PT Goals(Current goals can be found in the care plan section) Acute Rehab PT Goals Patient Stated Goal: Unable to determine due to pt very HOH PT Goal Formulation: Patient unable to participate in goal setting Time For Goal Achievement: 02/14/13 Potential to Achieve Goals: Fair  Visit Information  Last PT Received On:  02/07/13 Assistance Needed: +2 (for lines ) History of Present Illness: Pt adm from home after fall.       Prior Functioning  Home Living Family/patient expects to be discharged to:: Skilled nursing facility Living Arrangements: Alone Additional Comments: Lived next door to her son who used a camera to help monitor her. Prior Function Comments: Unable to determine due to [facility]. Communication Communication: HOH    Cognition  Cognition Arousal/Alertness: Awake/alert Overall Cognitive Status: Difficult to assess Difficult to assess due to: Hard of hearing/deaf    Extremity/Trunk Assessment Upper Extremity Assessment Upper Extremity Assessment: Generalized weakness Lower Extremity Assessment Lower Extremity Assessment: Generalized weakness   Balance Balance Balance Assessed: Yes Static Sitting Balance Static Sitting - Balance Support: Bilateral upper extremity supported Static Sitting - Level of Assistance: 4: Min Oncologist Standing - Balance Support: Bilateral upper extremity supported Static Standing - Level of Assistance: 3: Mod assist  End of Session PT - End of Session Equipment Utilized During Treatment: Gait belt Activity Tolerance: Patient limited by fatigue Patient left: in bed;with call bell/phone within reach;with bed alarm set Nurse Communication: Mobility status  GP     Kara [name] 02/07/2013, 2:12 PM  [employer] PT [phone]

## 2013-02-07 NOTE — Plan of Care (Signed)
Problem: Phase II Progression Outcomes Goal: Discharge plan established Outcome: Progressing SNF placement in process

## 2013-02-07 NOTE — Progress Notes (Signed)
Patient placed on telemetry for bradycardia.  Patient confused and pulling at wires consistently.  Telemetry removed, MD aware.

## 2013-02-07 NOTE — ED Provider Notes (Signed)
CSN: [id number]     Arrival date & time 02/06/13  2150 History   First MD Initiated Contact with Patient 02/07/13 0025     Chief Complaint  Patient presents with  . Fall   (Consider location/radiation/quality/duration/timing/severity/associated sxs/prior Treatment) HPI This patient is a very pleasant mildly demented 79 year old woman who lives at home by herself and ambulates with walker. Her son eats a very close eye on her and has cameras in her house. He brings her to the emergency department because she has had 2 falls today.  The first fall occurred this morning. She is trying to sit back in her chair but discharged distance and fell, landing on her bottom. He did not sustain head trauma. He is competent of this after reviewing the video.  This evening around 8:30 PM, the patient was walking and her walker became entangled and something. She abandoned the walker and walked to her bed without assistance. Again, she seemed to misjudged the distance while getting into her bed and fell to the floor landing on her bottom. Again, no head traumas was witnessed.  Son says that it is very unusual for the patient to fall. Last fall was about 2 months ago. The son would like the patient evaluated for traumatic injuries. The patient has complained of right hip pain. She has been ambulatory since the last fall. She is unable to rate or describe her pain. Son says she has osteoarthritis and chronic right hip pain.   Son also hopes to have the patient admitted for SNF placement.  He states he has POA. The son says he spoke to someone at the patient's PCP's office and was told that he should try to have the patient admitted to the hospital so that she can be placed.   Son says that the patient has not had any recent med changes and that he give patient her meds to assure medication compliance. He feels her po intake has been wnl.   Past Medical History  Diagnosis Date  . Dementia   . Hypertension   .  Diabetes mellitus    Past Surgical History  Procedure Laterality Date  . Hip fracture surgery     No family history on file. History  Substance Use Topics  . Smoking status: Never Smoker   . Smokeless tobacco: Not on file  . Alcohol Use: No   OB History   Grav Para Term Preterm Abortions TAB SAB Ect Mult Living                 Review of Systems Very limited ROS obtained from the patient due to difficulty hearing. Patient denies headache, chest pain, SOB. She seems most concerned about a chipped finger nail on her right middle finger.   Allergies  Oxybutynin chloride er and Miralax  Home Medications   Current Outpatient Rx  Name  Route  Sig  Dispense  Refill  . aspirin 81 MG EC tablet   Oral   Take 81 mg by mouth daily.           . calcium carbonate (TUMS - DOSED IN MG ELEMENTAL CALCIUM) 500 MG chewable tablet   Oral   Chew 1 tablet by mouth once. indigestion         . FeFum-FePoly-FA-B Cmp-C-Biot (INTEGRA PLUS) CAPS   Oral   Take 1 capsule by mouth 3 (three) times a week. Monday Wednesday and friday         . hydrochlorothiazide 25 MG tablet  Oral   Take 25 mg by mouth daily.           . nadolol (CORGARD) 20 MG tablet   Oral   Take 20 mg by mouth daily.           . potassium chloride (KLOR-CON) 10 MEQ CR tablet   Oral   Take 10 mEq by mouth daily.           . valsartan (DIOVAN) 160 MG tablet   Oral   Take 160 mg by mouth daily.            BP 102/56  Pulse 60  Temp(Src) 98.6 F (37 C) (Oral)  Resp 18  SpO2 100% Physical Exam Gen: well developed, frail appearing, alert.  Head: NCAT Eyes: Arcuous senilis, PERL, EOMI Nose: no epistaixis or rhinorrhea Mouth/throat: mucosa is mildly dehydrated appearing, mouth is edentulous Neck: supple, no stridor, no c spine ttp Lungs: CTA B, no wheezing, rhonchi or rales CV: RRR, no murmur, good peripheral pulses, cap refill < 2s. Abd: soft, notender, nondistended Back: marked kyphosis, no midline  ttp Skin: warm and dry, some purpura of the left posterior forearm noted, tenting present.  Neuro: CN ii-xii grossly intact, patient seems very HOH, she appears to have good and symmetric strength in all 4 extremities. No unilateral deficits. Gait not assessed.  Psyche; normal affect,  calm and cooperative.  MSK: mild ttp on palpation of the anterior right hip but FROM without pain at both hips, knees, ankles as well as all joints of the UE.   ED Course  Procedures (including critical care time)  Results for orders placed during the hospital encounter of 02/07/13 (from the past 24 hour(s))  COMPREHENSIVE METABOLIC PANEL     Status: Abnormal   Collection Time    02/06/13 10:10 PM      Result Value Range   Sodium 137  135 - 145 mEq/L   Potassium 4.7  3.5 - 5.1 mEq/L   Chloride 105  96 - 112 mEq/L   CO2 24  19 - 32 mEq/L   Glucose, Bld 79  70 - 99 mg/dL   BUN 28 (*) 6 - 23 mg/dL   Creatinine, Ser 4.09 (*) 0.50 - 1.10 mg/dL   Calcium 8.9  8.4 - 81.1 mg/dL   Total Protein 6.9  6.0 - 8.3 g/dL   Albumin 2.8 (*) 3.5 - 5.2 g/dL   AST 34  0 - 37 U/L   ALT 15  0 - 35 U/L   Alkaline Phosphatase 37 (*) 39 - 117 U/L   Total Bilirubin 0.6  0.3 - 1.2 mg/dL   GFR calc non Af Amer 31 (*) >90 mL/min   GFR calc Af Amer 36 (*) >90 mL/min  CBC     Status: Abnormal   Collection Time    02/06/13 10:10 PM      Result Value Range   WBC 6.2  4.0 - 10.5 K/uL   RBC 3.05 (*) 3.87 - 5.11 MIL/uL   Hemoglobin 9.2 (*) 12.0 - 15.0 g/dL   HCT 91.4 (*) 78.2 - 95.6 %   MCV 91.1  78.0 - 100.0 fL   MCH 30.2  26.0 - 34.0 pg   MCHC 33.1  30.0 - 36.0 g/dL   RDW 21.3  08.6 - 57.8 %   Platelets 205  150 - 400 K/uL  POCT I-STAT TROPONIN I     Status: None   Collection Time    02/07/13  2:02 AM      Result Value Range   Troponin i, poc 0.01  0.00 - 0.08 ng/mL   Comment 3           URINALYSIS, ROUTINE W REFLEX MICROSCOPIC     Status: Abnormal   Collection Time    02/07/13  2:06 AM      Result Value Range    Color, Urine YELLOW  YELLOW   APPearance CLOUDY (*) CLEAR   Specific Gravity, Urine 1.015  1.005 - 1.030   pH 6.5  5.0 - 8.0   Glucose, UA NEGATIVE  NEGATIVE mg/dL   Hgb urine dipstick LARGE (*) NEGATIVE   Bilirubin Urine NEGATIVE  NEGATIVE   Ketones, ur NEGATIVE  NEGATIVE mg/dL   Protein, ur NEGATIVE  NEGATIVE mg/dL   Urobilinogen, UA 0.2  0.0 - 1.0 mg/dL   Nitrite NEGATIVE  NEGATIVE   Leukocytes, UA LARGE (*) NEGATIVE  URINE MICROSCOPIC-ADD ON     Status: Abnormal   Collection Time    02/07/13  2:06 AM      Result Value Range   Squamous Epithelial / LPF FEW (*) RARE   WBC, UA 21-50  <3 WBC/hpf   RBC / HPF 21-50  <3 RBC/hpf   Bacteria, UA FEW (*) RARE   Casts HYALINE CASTS (*) NEGATIVE   EKG: nsr, no acute ischemic changes, normal intervals, normal axis, normal qrs complex  MDM   DDX: dehydration, mechanical fall, electrolyte disturbance, silent MI, anemia, occult infection.   ED work up is thus far notable for anemia with 2 gram drop in hemoglobin from most recent. Normal MCV. We will check stool for occult blood. Patient noted to have UTI - we will send urine for culture and treat empirically with Keflex po. Patient also noted to have some mild AKI.  The patient has multiple indications for admission and I have paged the hospitalist to request that the patient be admitted.    Brandt Loosen, MD 02/07/13 [phone]

## 2013-02-07 NOTE — H&P (Signed)
[facility] History and Physical  Kara [name] WUJ:[id number] DOB: [birth year] DOA: 02/07/2013  Referring physician: ER physician. PCP: Laurena Slimmer, MD   Chief Complaint: Fall. History obtained from patient's son.  HPI: Kara [name] is a 79 y.o. female with history of hypertension was brought to the ER after patient had 2 falls yesterday. Patient lives alone but is closely monitored by her son. Her son usually monitors her through the camera and found that patient had 2 falls yesterday. Both of the time patient fell on her back did not hit her head or lose consciousness. She fell after being entangled in the walker she uses. In the ER which and complaint of pain in the right hip x-rays were negative for any fracture. Presently on my exam patient denies any pain and on moving the extremities has no pain. Patient's son is concerned about her safety and wants placement. Labs show anemia and possible UTI. Patient also has mildly elevated creatinine from baseline.   Review of Systems: As presented in the history of presenting illness, rest negative.  Past Medical History  Diagnosis Date  . Dementia   . Hypertension   . Diabetes mellitus    Past Surgical History  Procedure Laterality Date  . Hip fracture surgery     Social History:  reports that she has never smoked. She does not have any smokeless tobacco history on file. She reports that she does not drink alcohol or use illicit drugs. Where does patient live home. Can patient participate in ADLs? Not sure.  No Known Allergies  Family History: History reviewed. No pertinent family history.    Prior to Admission medications   Medication Sig Start Date End Date Taking? Authorizing Provider  aspirin 81 MG EC tablet Take 81 mg by mouth daily.   08/13/06  Yes Historical Provider, MD  FeFum-FePoly-FA-B Cmp-C-Biot (INTEGRA PLUS) CAPS Take 1 capsule by mouth every Monday, Wednesday, and Friday. Monday Wednesday and friday    Yes Historical Provider, MD  nadolol (CORGARD) 20 MG tablet Take 20 mg by mouth daily.  08/12/08  Yes Historical Provider, MD  potassium chloride (KLOR-CON) 10 MEQ CR tablet Take 10 mEq by mouth daily.   08/12/08  Yes Historical Provider, MD  triamterene-hydrochlorothiazide (MAXZIDE-25) 37.5-25 MG per tablet Take 1 tablet by mouth daily.   Yes Historical Provider, MD  valsartan (DIOVAN) 160 MG tablet Take 160 mg by mouth daily.   08/12/08  Yes Historical Provider, MD    Physical Exam: Filed Vitals:   02/06/13 2158 02/07/13 0048 02/07/13 0115 02/07/13 0130  BP: 102/56 118/54 118/47 109/56  Pulse: 60 60 57 52  Temp: 98.6 F (37 C)     TempSrc: Oral     Resp: 18  22 24   SpO2: 100% 100% 100% 97%     General:  Well-developed and poorly nourished.  Eyes: Anicteric no pallor.  ENT: No discharge from the ears eyes nose mouth.  Neck: No mass felt.  Cardiovascular: S1-S2 heard.  Respiratory: No rhonchi or crepitations.  Abdomen: Soft nontender bowel sounds present.  Skin: No rash.  Musculoskeletal: No edema.  Psychiatric: Appears normal.  Neurologic: Alert awake oriented to time place and person. Moves all extremities.  Labs on Admission:  Basic Metabolic Panel:  Recent Labs Lab 02/06/13 2210  NA 137  K 4.7  CL 105  CO2 24  GLUCOSE 79  BUN 28*  CREATININE 1.31*  CALCIUM 8.9   Liver Function Tests:  Recent Labs Lab 02/06/13 2210  AST  34  ALT 15  ALKPHOS 37*  BILITOT 0.6  PROT 6.9  ALBUMIN 2.8*   No results found for this basename: LIPASE, AMYLASE,  in the last 168 hours No results found for this basename: AMMONIA,  in the last 168 hours CBC:  Recent Labs Lab 02/06/13 2210  WBC 6.2  HGB 9.2*  HCT 27.8*  MCV 91.1  PLT 205   Cardiac Enzymes: No results found for this basename: CKTOTAL, CKMB, CKMBINDEX, TROPONINI,  in the last 168 hours  BNP (last 3 results) No results found for this basename: PROBNP,  in the last 8760 hours CBG: No results found  for this basename: GLUCAP,  in the last 168 hours  Radiological Exams on Admission: Dg Chest 1 View  02/07/2013   *RADIOLOGY REPORT*  Clinical Data: Status post fall; concern for chest injury.  CHEST - 1 VIEW  Comparison: Chest radiograph performed 09/25/2008  Findings: The lungs are well expanded.  Vascular congestion is noted.  No pleural effusion or pneumothorax is seen.  Vague left midlung density is thought to reflect overlying soft tissues.  The cardiomediastinal silhouette is mildly enlarged.  No acute osseous abnormalities are identified.  IMPRESSION: Vascular congestion noted; lungs remain grossly clear. Displaced rib fractures seen.   Original Report Authenticated By: Tonia Ghent, M.D.   Dg Hip Complete Right  02/07/2013   *RADIOLOGY REPORT*  Clinical Data: Status post fall; right hip pain.  RIGHT HIP - COMPLETE 2+ VIEW  Comparison: Right hip radiographs performed 04/11/2006  Findings: There is no evidence of fracture or dislocation.  The patient's right hip arthroplasty appears grossly intact, without evidence of loosening.  The proximal right femur appears intact. Mild degenerative change is noted at the lower lumbar spine.  The sacroiliac joints are unremarkable in appearance.  The visualized bowel gas pattern is grossly unremarkable in appearance.  Scattered phleboliths are noted within the pelvis.  A calcified fibroid is noted.  IMPRESSION:  1.  No evidence of fracture or dislocation. 2.  Right hip arthroplasty appears grossly intact, without evidence of loosening.   Original Report Authenticated By: Tonia Ghent, M.D.    EKG: Independently reviewed. Normal sinus rhythm.  Assessment/Plan Principal Problem:   Falls Active Problems:   Hypertension   Anemia   UTI (lower urinary tract infection)   Dehydration   1. Dehydration - patient did receive 1 L normal saline in the ER. Hold all diuretics for now. Follow metabolic panel. 2. UTI - on ceftriaxone. Check urine  culture. 3. Anemia - follow CBC. 4. Hypertension - holding HCTZ due to dehydration. Continue other medications. When necessary IV hydralazine for systolic blood pressure more than 160.  Social worker and physical therapy consult for possible placement.    Code Status: Full code.  Family Communication: Patient's son at the bedside.  Disposition Plan: Admit to inpatient.    Dmoni Fortson N. [facility] Pager [phone].  If 7PM-7AM, please contact night-coverage [email or web address] Password [email or web address] 02/07/2013, 4:14 AM

## 2013-02-07 NOTE — Progress Notes (Signed)
Patient placed on telemetry.  Notified CMT.  HR mid 40's dropping down to 39 while sleeping.  Dr. Thedore Mins aware.  Beta blockers discontinued.  Will continue to monitor.

## 2013-02-08 LAB — URINE CULTURE

## 2013-02-08 MED ORDER — ENSURE COMPLETE PO LIQD
237.0000 mL | ORAL | Status: DC
  Administered 2013-02-08 – 2013-02-09 (×2): 237 mL via ORAL

## 2013-02-08 NOTE — Progress Notes (Signed)
PT Cancellation Note  Patient Details Name: Kara [name] MRN: [id number] DOB: [birth year]   Cancelled Treatment:    Reason Eval/Treat Not Completed: Other (comment) (pt confused did not want to go walking right now.)  PT to check back later this week.     Rollene Rotunda Clova Morlock, PT, DPT [phone]   02/08/2013, 3:57 PM

## 2013-02-08 NOTE — Progress Notes (Signed)
Pt. Trying to get OOB, helped pt. To [facility] and placed in recliner with chair alarm.  Will continue to monitor and keep pt. Safe.  Forbes Cellar, RN

## 2013-02-08 NOTE — Progress Notes (Addendum)
[facility]                                                                                Patient Demographics  Kara [name], is a 79 y.o. female, DOB - 07-21-1905, ZOX:[id number]  Admit date - 02/07/2013   Admitting Physician Eduard Clos, MD  Outpatient Primary MD for the patient is Laurena Slimmer, MD  LOS - 1   Chief Complaint  Patient presents with  . Fall        Assessment & Plan    Dehydration  was in generalized weakness and falls- stable post IVF, Hold all diuretics for now. She feels better, -ve orthostatics, increase activity and have PT evaluate the patient. She continues to feel better.  Hip x-rays are stable and so his chest x-ray no signs of acute bony injuries. Will need Placement.     Underlying Dementia - Patient continues to be at risk for delirium due to advanced age and probably some senile dementia in the light of her age. Fall and aspiration precautions. High fall risk and repeatedly tries to get out of the bed per RN, will have to keep all 4 rails up if fall risk remains high.     UTI - on ceftriaxone. Monitor urine culture.     AOCD - follow CBC.     Hypertension with sinus bradycardia - holding HCTZ due to dehydration, stopped beta blocker due to underlying sinus bradycardia, placed on hydralazine along with home dose ARB and monitor. Telemetry monitoring , stable TSH. Bradycardia much better, BP stable at all times.   Lab Results  Component Value Date   TSH 1.573 02/07/2013       Code Status: Full  Family Communication: none present   Disposition Plan: Home   Procedures    Consults    DVT Prophylaxis   SCDs   Lab Results  Component Value Date   PLT 213 02/07/2013    Medications  Scheduled Meds: . cefTRIAXone (ROCEPHIN)  IV  1 g Intravenous Q24H  . hydrALAZINE  25 mg Oral Q8H  . irbesartan  75 mg Oral Daily  . multivitamins with iron  1 tablet Oral Q M,W,F  . sodium chloride  3 mL Intravenous  Q12H   Continuous Infusions:  PRN Meds:.acetaminophen, acetaminophen, hydrALAZINE, ondansetron (ZOFRAN) IV, ondansetron  Antibiotics    Anti-infectives   Start     Dose/Rate Route Frequency Ordered Stop   02/07/13 0430  cefTRIAXone (ROCEPHIN) 1 g in dextrose 5 % 50 mL IVPB     1 g 100 mL/hr over 30 Minutes Intravenous Every 24 hours 02/07/13 0417     02/07/13 0245  cephALEXin (KEFLEX) capsule 500 mg     500 mg Oral  Once 02/07/13 0239 02/07/13 0306       Time Spent in minutes   35   SINGH,PRASHANT K M.D on 02/08/2013 at 8:07 AM  Between 7am to 7pm - Pager - [phone]  After 7pm go to [email or web address] - password [email or web address]  And look for the night coverage person covering for me after hours  [facility] Office  [phone]    Subjective:   [name] today has, No  headache, No chest pain, No abdominal pain - No Nausea, No new weakness tingling or numbness, No Cough - SOB.   Objective:   Filed Vitals:   02/07/13 1217 02/07/13 1356 02/07/13 2159 02/08/13 0637  BP: 126/55 137/60 123/59 114/49  Pulse: 56 51 58 52  Temp:  97.7 F (36.5 C) 98.3 F (36.8 C) 98.6 F (37 C)  TempSrc:  Oral Oral Oral  Resp:  20 20 21   Height:      Weight:      SpO2:  93% 95% 90%    Wt Readings from Last 3 Encounters:  02/07/13 43.092 kg (95 lb)     Intake/Output Summary (Last 24 hours) at 02/08/13 0807 Last data filed at 02/07/13 2246  Gross per 24 hour  Intake    243 ml  Output      1 ml  Net    242 ml    Exam Awake Alert, Oriented X 3, No new F.N deficits, Normal affect [place].AT,PERRAL Supple Neck,No JVD, No cervical lymphadenopathy appriciated.  Symmetrical Chest wall movement, Good air movement bilaterally, CTAB RRR,No Gallops,Rubs or new Murmurs, No Parasternal Heave +ve B.Sounds, Abd Soft, Non tender, No organomegaly appriciated, No rebound - guarding or rigidity. No Cyanosis, Clubbing or edema, No new Rash or bruise    Data Review   Micro Results No  results found for this or any previous visit (from the past 240 hour(s)).  Radiology Reports Dg Chest 1 View  02/07/2013   *RADIOLOGY REPORT*  Clinical Data: Status post fall; concern for chest injury.  CHEST - 1 VIEW  Comparison: Chest radiograph performed 09/25/2008  Findings: The lungs are well expanded.  Vascular congestion is noted.  No pleural effusion or pneumothorax is seen.  Vague left midlung density is thought to reflect overlying soft tissues.  The cardiomediastinal silhouette is mildly enlarged.  No acute osseous abnormalities are identified.  IMPRESSION: Vascular congestion noted; lungs remain grossly clear. Displaced rib fractures seen.   Original Report Authenticated By: Tonia Ghent, M.D.   Dg Hip Complete Right  02/07/2013   *RADIOLOGY REPORT*  Clinical Data: Status post fall; right hip pain.  RIGHT HIP - COMPLETE 2+ VIEW  Comparison: Right hip radiographs performed 04/11/2006  Findings: There is no evidence of fracture or dislocation.  The patient's right hip arthroplasty appears grossly intact, without evidence of loosening.  The proximal right femur appears intact. Mild degenerative change is noted at the lower lumbar spine.  The sacroiliac joints are unremarkable in appearance.  The visualized bowel gas pattern is grossly unremarkable in appearance.  Scattered phleboliths are noted within the pelvis.  A calcified fibroid is noted.  IMPRESSION:  1.  No evidence of fracture or dislocation. 2.  Right hip arthroplasty appears grossly intact, without evidence of loosening.   Original Report Authenticated By: Tonia Ghent, M.D.    CBC  Recent Labs Lab 02/06/13 2210 02/07/13 0530  WBC 6.2 6.2  HGB 9.2* 9.6*  HCT 27.8* 29.2*  PLT 205 213  MCV 91.1 90.7  MCH 30.2 29.8  MCHC 33.1 32.9  RDW 14.9 14.9    Chemistries   Recent Labs Lab 02/06/13 2210 02/07/13 0530  NA 137 140  K 4.7 4.4  CL 105 107  CO2 24 25  GLUCOSE 79 87  BUN 28* 26*  CREATININE 1.31* 1.20*   CALCIUM 8.9 8.9  AST 34  --   ALT 15  --   ALKPHOS 37*  --   BILITOT 0.6  --    ------------------------------------------------------------------------------------------------------------------  estimated creatinine clearance is 14.8 ml/min (by C-G formula based on Cr of 1.2). ------------------------------------------------------------------------------------------------------------------ No results found for this basename: HGBA1C,  in the last 72 hours ------------------------------------------------------------------------------------------------------------------ No results found for this basename: CHOL, HDL, LDLCALC, TRIG, CHOLHDL, LDLDIRECT,  in the last 72 hours ------------------------------------------------------------------------------------------------------------------  Recent Labs  02/07/13 1000  TSH 1.573   ------------------------------------------------------------------------------------------------------------------ No results found for this basename: VITAMINB12, FOLATE, FERRITIN, TIBC, IRON, RETICCTPCT,  in the last 72 hours  Coagulation profile No results found for this basename: INR, PROTIME,  in the last 168 hours  No results found for this basename: DDIMER,  in the last 72 hours  Cardiac Enzymes No results found for this basename: CK, CKMB, TROPONINI, MYOGLOBIN,  in the last 168 hours ------------------------------------------------------------------------------------------------------------------ No components found with this basename: POCBNP,

## 2013-02-08 NOTE — Clinical Social Work Psychosocial (Signed)
Clinical Social Work Department BRIEF PSYCHOSOCIAL ASSESSMENT 02/08/2013  Patient:  [facility]     Account Number:  [id number]     Admit date:  02/06/2013  Clinical Social Worker:  Lavell Luster  Date/Time:  02/08/2013 01:36 PM  Referred by:  Physician  Date Referred:  02/08/2013 Referred for  SNF Placement   Other Referral:   Interview type:  Family Other interview type:   CSW interviewed patient's son.    PSYCHOSOCIAL DATA Living Status:  ALONE Admitted from facility:   Level of care:   Primary support name:  Paislynn Hegstrom   [phone] Primary support relationship to patient:  CHILD, ADULT Degree of support available:   Support is good.    CURRENT CONCERNS Current Concerns  Post-Acute Placement   Other Concerns:    SOCIAL WORK ASSESSMENT / PLAN CSW spoke with patient's son to discuss recommendation for SNF placement and explain SNF search process. Son is agreeable to SNF placement and would like patient placed at [facility]. Per son patient was living at home alone prior to hospital admission. Son installed a camera system in patient's home to "keep and eye" on her, but he understands that this is not enough to keep the patient safe at this time. Patient's wife has been at bedside and she also supports the decision of SNF placement. Patient not alert or oriented at time of assessment.   Assessment/plan status:  Psychosocial Support/Ongoing Assessment of Needs Other assessment/ plan:   Complete FL2, PASRR, Faxout.   Information/referral to community resources:   SNF list and CSW contact information left with daughter in-law.    PATIENT'S/FAMILY'S RESPONSE TO PLAN OF CARE: Son and daughter in-law of patient agreeable to SNF placement for patient and are hopefull that patient will DC to [facility]. Son and daughter in-law are pleasant and appreciative of CSW contact. Family awaits confirmation of bed availability at [facility].     Roddie Mc,  [place], [place], [phone]

## 2013-02-08 NOTE — Progress Notes (Signed)
Ordered for all SR to be up.  Pt. Resting in bed asleep.  Will await to pt. Wake up to place all SR up.  Alson Mcpheeters Laural Benes, Charity fundraiser.

## 2013-02-08 NOTE — Progress Notes (Signed)
INITIAL NUTRITION ASSESSMENT  DOCUMENTATION CODES Per approved criteria  -Not Applicable   INTERVENTION: Recommend diet liberalization to Regular diet. Add Strawberry Ensure, per pt request. Downgrade diet to Dysphagia 3, per family request. RD to continue to follow nutrition care plan.  NUTRITION DIAGNOSIS: Inadequate oral intake related to acute illness as evidenced by poor meal completion.   Goal: Intake to meet >90% of estimated nutrition needs.  Monitor:  weight trends, lab trends, I/O's, PO intake, supplement tolerance  Reason for Assessment: Malnutrition Screening Tool  79 y.o. female  Admitting Dx: Falls  ASSESSMENT: PMHx significant for HTN, very HOH, very poor vision. Admitted s/p 2 falls. Work-up reveals dehydration and UTI.  Note that pt is 79 years old. SNF placement process is ongoing.  RD discussed nutrition hx with family at bedside; pt is currently resting. Per family, pt was eating well PTA and had a good appetite. Drinks Strawberry Boost regularly and prefers soft foods. Pt ate approximately 50% of breakfast this morning.   Height: Ht Readings from Last 1 Encounters:  02/07/13 5' (1.524 m)    Weight: Wt Readings from Last 1 Encounters:  02/07/13 95 lb (43.092 kg)    Ideal Body Weight: 100 lb  % Ideal Body Weight: 95%  Wt Readings from Last 10 Encounters:  02/07/13 95 lb (43.092 kg)    Usual Body Weight: n/a  % Usual Body Weight: n/a  BMI:  Body mass index is 18.55 kg/(m^2). WNL  Estimated Nutritional Needs: Kcal: 1200 - 1500 kcal Protein: 43 - 50 g Fluid: 1.2 - 1.5 liters  Skin: leg abrasion and hip abrasion  Diet Order: Cardiac  EDUCATION NEEDS: -No education needs identified at this time   Intake/Output Summary (Last 24 hours) at 02/08/13 0937 Last data filed at 02/07/13 2246  Gross per 24 hour  Intake    243 ml  Output      1 ml  Net    242 ml    Last BM: 10/13   Labs:   Recent Labs Lab 02/06/13 2210  02/07/13 0530  NA 137 140  K 4.7 4.4  CL 105 107  CO2 24 25  BUN 28* 26*  CREATININE 1.31* 1.20*  CALCIUM 8.9 8.9  GLUCOSE 79 87    CBG (last 3)  No results found for this basename: GLUCAP,  in the last 72 hours  Scheduled Meds: . cefTRIAXone (ROCEPHIN)  IV  1 g Intravenous Q24H  . hydrALAZINE  25 mg Oral Q8H  . irbesartan  75 mg Oral Daily  . multivitamins with iron  1 tablet Oral Q M,W,F  . sodium chloride  3 mL Intravenous Q12H    Continuous Infusions:   Past Medical History  Diagnosis Date  . Dementia   . Hypertension   . Diabetes mellitus     Past Surgical History  Procedure Laterality Date  . Hip fracture surgery      Jarold Motto MS, RD, LDN Pager: [phone] After-hours pager: [phone]

## 2013-02-08 NOTE — Clinical Social Work Placement (Signed)
Clinical Social Work Department CLINICAL SOCIAL WORK PLACEMENT NOTE 02/08/2013  Patient:  Kara [name]  Account Number:  [id number] Admit date:  02/06/2013  Clinical Social Worker:  Cherre Blanc, [place]  Date/time:  02/08/2013 04:09 PM  Clinical Social Work is seeking post-discharge placement for this patient at the following level of care:   SKILLED NURSING   (*CSW will update this form in Epic as items are completed)   02/08/2013  Patient/family provided with [facility] Department of Clinical Social Work's list of facilities offering this level of care within the geographic area requested by the patient (or if unable, by the patient's family).  02/08/2013  Patient/family informed of their freedom to choose among providers that offer the needed level of care, that participate in Medicare, Medicaid or managed care program needed by the patient, have an available bed and are willing to accept the patient.  02/08/2013  Patient/family informed of [facility]' ownership interest in [facility], as well as of the fact that they are under no obligation to receive care at this facility.  PASARR submitted to EDS on  PASARR number received from EDS on   FL2 transmitted to all facilities in geographic area requested by pt/family on  02/08/2013 FL2 transmitted to all facilities within larger geographic area on   Patient informed that his/her managed care company has contracts with or will negotiate with  certain facilities, including the following:     Patient/family informed of bed offers received:   Patient chooses bed at  Physician recommends and patient chooses bed at    Patient to be transferred to  on   Patient to be transferred to facility by   The following physician request were entered in Epic:   Additional Comments:   Roddie Mc, [place], [place], [phone]

## 2013-02-09 DIAGNOSIS — E119 Type 2 diabetes mellitus without complications: Secondary | ICD-10-CM

## 2013-02-09 MED ORDER — POLYETHYLENE GLYCOL 3350 17 G PO PACK
17.0000 g | PACK | Freq: Every day | ORAL | Status: DC
  Administered 2013-02-09 – 2013-02-10 (×2): 17 g via ORAL
  Filled 2013-02-09 (×2): qty 1

## 2013-02-09 MED ORDER — BISACODYL 10 MG RE SUPP
10.0000 mg | Freq: Once | RECTAL | Status: AC
  Administered 2013-02-09: 09:00:00 10 mg via RECTAL

## 2013-02-09 MED ORDER — FLEET ENEMA 7-19 GM/118ML RE ENEM
1.0000 | ENEMA | Freq: Once | RECTAL | Status: AC
  Administered 2013-02-09: 1 via RECTAL
  Filled 2013-02-09: qty 1

## 2013-02-09 MED ORDER — BISACODYL 10 MG RE SUPP
RECTAL | Status: AC
  Filled 2013-02-09: qty 1

## 2013-02-09 MED ORDER — HYDROCORTISONE ACE-PRAMOXINE 1-1 % RE FOAM
1.0000 | Freq: Three times a day (TID) | RECTAL | Status: DC
  Administered 2013-02-09 – 2013-02-10 (×3): 1 via RECTAL
  Filled 2013-02-09: qty 10

## 2013-02-09 NOTE — Progress Notes (Signed)
  I have directly reviewed the clinical findings, lab, imaging studies and management of this patient in detail. I have interviewed and examined the patient and agree with the documentation,  as recorded by the Physician extender.  Leroy Sea M.D on 02/09/2013 at 9:54 AM  [facility] Office  [phone]

## 2013-02-09 NOTE — Progress Notes (Signed)
Physical Therapy Treatment Patient Details Name: Kara [name] MRN: [id number] DOB: 1905/06/26 Today's Date: 02/09/2013 Time: 2952-8413 PT Time Calculation (min): 16 min  PT Assessment / Plan / Recommendation  History of Present Illness Pt adm from home after fall.   PT Comments   Pt making good progress with mobility.  Follow Up Recommendations  SNF     Does the patient have the potential to tolerate intense rehabilitation     Barriers to Discharge        Equipment Recommendations  None recommended by PT    Recommendations for Other Services    Frequency Min 2X/week   Progress towards PT Goals Progress towards PT goals: Progressing toward goals;Goals met and updated - see care plan  Plan Current plan remains appropriate    Precautions / Restrictions Precautions Precautions: Fall Restrictions Weight Bearing Restrictions: No   Pertinent Vitals/Pain No specific c/o's.    Mobility  Transfers Sit to Stand: 4: Min assist;From chair/3-in-1 Stand to Sit: 4: Min assist;To chair/3-in-1 Details for Transfer Assistance: Placed pt's hands on walker to allow her to know where walker was. Assist to bring hips up and to control descent. Ambulation/Gait Ambulation/Gait Assistance: 4: Min assist Ambulation Distance (Feet): 30 Feet Assistive device: Rolling walker Ambulation/Gait Assistance Details: Assist to steer walker and to support Gait Pattern: Step-to pattern;Decreased step length - right;Decreased step length - left;Right flexed knee in stance;Left flexed knee in stance;Narrow base of support;Trunk flexed Gait velocity: decr    Exercises     PT Diagnosis:    PT Problem List:   PT Treatment Interventions:     PT Goals (current goals can now be found in the care plan section) Acute Rehab PT Goals PT Goal Formulation: Patient unable to participate in goal setting Time For Goal Achievement: 02/16/13 Potential to Achieve Goals: Fair  Visit Information  Last PT  Received On: 02/09/13 Assistance Needed: +1 History of Present Illness: Pt adm from home after fall.    Subjective Data      Cognition  Cognition Arousal/Alertness: Awake/alert Overall Cognitive Status: Difficult to assess Difficult to assess due to: Hard of hearing/deaf    Balance  Static Standing Balance Static Standing - Balance Support: Bilateral upper extremity supported Static Standing - Level of Assistance: 4: Min assist  End of Session PT - End of Session Equipment Utilized During Treatment: Gait belt Activity Tolerance: Patient tolerated treatment well Patient left: in chair;with call bell/phone within reach;with chair alarm set;with family/visitor present Nurse Communication: Mobility status   GP     Jonathan Kirkendoll 02/09/2013, 11:43 AM  [employer] PT [phone]

## 2013-02-09 NOTE — Progress Notes (Signed)
Utilization review completed.  

## 2013-02-09 NOTE — Progress Notes (Signed)
Pt refused to get her vitals taken. RN and nurse tech tried multiple times with in a 2 hour period and pt still did not allow her vitals to be taken. Will continue to monitor

## 2013-02-09 NOTE — Progress Notes (Signed)
[facility]                                                                                Patient Demographics  Kara [name], is a 79 y.o. female, DOB - 1905/11/04, WUJ:[id number]  Admit date - 02/07/2013   Admitting Physician Kara Clos, MD  Outpatient Primary MD for the patient is Kara Slimmer, MD  LOS - 2   Chief Complaint  Patient presents with  . Fall        Assessment & Plan    Dehydration  with generalized weakness and falls -stable post IVF, Continue to hold all diuretics (HCTZ) for now.  -ve orthostatics, increase activity.  PT recommends SNF.  -Hip x-rays are stable and so his chest x-ray no signs of acute bony injuries.    Underlying Dementia (Hard of hearing) - Patient continues to be at risk for delirium due to advanced age and probably some senile dementia in the light of her age.  - Fall and aspiration precautions. High fall risk and repeatedly tries to get out of the bed per RN, will have to keep all 4 rails up if fall risk remains high.   UTI  - on ceftriaxone. Monitor urine culture.    Anemia of chronic disease. - follow CBC.   Constipation -Stool softeners, Fleet enema, Suppository PRN  Significant deconditioning -mobilize as much as possible in the hospital -SNF at discharge.   Hypertension with sinus bradycardia  - holding HCTZ due to dehydration  -stopped beta blocker due to underlying sinus bradycardia -placed on hydralazine along with home dose ARB and monitor.  -Telemetry monitoring  -stable TSH   Lab Results  Component Value Date   TSH 1.573 02/07/2013       Code Status: Full.  Dr. Thedore [name] is discussing with family.  Family Communication: none present   Disposition Plan: SNF at discharge.   Procedures    Consults    DVT Prophylaxis   SCDs   Lab Results  Component Value Date   PLT 213 02/07/2013    Medications  Scheduled Meds: . bisacodyl      . bisacodyl  10 mg Rectal Once  .  cefTRIAXone (ROCEPHIN)  IV  1 g Intravenous Q24H  . feeding supplement (ENSURE COMPLETE)  237 mL Oral Q24H  . irbesartan  75 mg Oral Daily  . multivitamins with iron  1 tablet Oral Q M,W,F  . sodium chloride  3 mL Intravenous Q12H   Continuous Infusions:  PRN Meds:.acetaminophen, acetaminophen, hydrALAZINE, ondansetron (ZOFRAN) IV, ondansetron  Antibiotics    Anti-infectives   Start     Dose/Rate Route Frequency Ordered Stop   02/07/13 0430  cefTRIAXone (ROCEPHIN) 1 g in dextrose 5 % 50 mL IVPB     1 g 100 mL/hr over 30 Minutes Intravenous Every 24 hours 02/07/13 0417     02/07/13 0245  cephALEXin (KEFLEX) capsule 500 mg     500 mg Oral  Once 02/07/13 0239 02/07/13 0306       Time Spent in minutes   [address], PA-C [phone] 02/09/2013 at 9:15 AM  Between 7am to 7pm - Pager - [phone]  After 7pm go  to [email or web address] - password [email or web address]  And look for the night coverage person covering for me after hours  [facility] Office  [phone]    Subjective:   Kara [name] complains about the IV in her right arm.  Otherwise denies any pain or complaints. Requests an enema to have a bowel movement.  Objective:   Filed Vitals:   02/08/13 1056 02/08/13 1420 02/08/13 2200 02/09/13 0704  BP: 155/50 119/47 144/50 119/64  Pulse: 61 59 60 51  Temp: 97.8 F (36.6 C) 97.5 F (36.4 C) 98.2 F (36.8 C) 97.4 F (36.3 C)  TempSrc: Oral Oral Oral Oral  Resp: 20 18 18 18   Height:      Weight:      SpO2: 90% 98% 100% 92%    Wt Readings from Last 3 Encounters:  02/07/13 43.092 kg (95 lb)     Intake/Output Summary (Last 24 hours) at 02/09/13 0915 Last data filed at 02/08/13 1800  Gross per 24 hour  Intake    480 ml  Output      2 ml  Net    478 ml    Exam General:  Awake Alert, Oriented X 3, No new F.N deficits, Normal affect. Very hard of hearing. HEENT: [place].AT,PERRAL Neck:  Supple Neck,No JVD, No cervical lymphadenopathy appriciated.  Chest:   Symmetrical, no accessory muscle movement., CTAB CV:   RRR,No Gallops,Rubs or new Murmurs, No Parasternal Heave Abdomen:  +ve B.Sounds, Abd Soft, Non tender, No organomegaly appriciated, No rebound - guarding or rigidity. Extremities:  No Cyanosis, Clubbing or edema, No new Rash or bruise    Data Review   Micro Results Recent Results (from the past 240 hour(s))  URINE CULTURE     Status: None   Collection Time    02/07/13  2:06 AM      Result Value Range Status   Specimen Description URINE, CLEAN CATCH   Final   Special Requests CX ADDED AT 0229 ON [id number]   Final   Culture  Setup Time     Final   Value: 02/07/2013 02:49     Performed at [facility] Count     Final   Value: >=100,000 COLONIES/ML     Performed at [facility]   Culture     Final   Value: DIPHTHEROIDS(CORYNEBACTERIUM SPECIES)     Note: Standardized susceptibility testing for this organism is not available.     Performed at [facility]   Report Status 02/08/2013 FINAL   Final    Radiology Reports Dg Chest 1 View  02/07/2013   *RADIOLOGY REPORT*  Clinical Data: Status post fall; concern for chest injury.  CHEST - 1 VIEW  Comparison: Chest radiograph performed 09/25/2008  Findings: The lungs are well expanded.  Vascular congestion is noted.  No pleural effusion or pneumothorax is seen.  Vague left midlung density is thought to reflect overlying soft tissues.  The cardiomediastinal silhouette is mildly enlarged.  No acute osseous abnormalities are identified.  IMPRESSION: Vascular congestion noted; lungs remain grossly clear. Displaced rib fractures seen.   Original Report Authenticated By: Tonia Ghent, M.D.   Dg Hip Complete Right  02/07/2013   *RADIOLOGY REPORT*  Clinical Data: Status post fall; right hip pain.  RIGHT HIP - COMPLETE 2+ VIEW  Comparison: Right hip radiographs performed 04/11/2006  Findings: There is no evidence of fracture or dislocation.  The patient's right hip  arthroplasty appears grossly intact, without evidence of loosening.  The proximal right femur appears intact. Mild degenerative change is noted at the lower lumbar spine.  The sacroiliac joints are unremarkable in appearance.  The visualized bowel gas pattern is grossly unremarkable in appearance.  Scattered phleboliths are noted within the pelvis.  A calcified fibroid is noted.  IMPRESSION:  1.  No evidence of fracture or dislocation. 2.  Right hip arthroplasty appears grossly intact, without evidence of loosening.   Original Report Authenticated By: Tonia Ghent, M.D.    CBC  Recent Labs Lab 02/06/13 2210 02/07/13 0530  WBC 6.2 6.2  HGB 9.2* 9.6*  HCT 27.8* 29.2*  PLT 205 213  MCV 91.1 90.7  MCH 30.2 29.8  MCHC 33.1 32.9  RDW 14.9 14.9    Chemistries   Recent Labs Lab 02/06/13 2210 02/07/13 0530  NA 137 140  K 4.7 4.4  CL 105 107  CO2 24 25  GLUCOSE 79 87  BUN 28* 26*  CREATININE 1.31* 1.20*  CALCIUM 8.9 8.9  AST 34  --   ALT 15  --   ALKPHOS 37*  --   BILITOT 0.6  --      Recent Labs  02/07/13 1000  TSH 1.573

## 2013-02-10 MED ORDER — HYDROCORTISONE ACE-PRAMOXINE 1-1 % RE FOAM: 1.0000 | Freq: Three times a day (TID) | RECTAL | Status: AC

## 2013-02-10 MED ORDER — POLYETHYLENE GLYCOL 3350 17 G PO PACK: 17.0000 g | PACK | Freq: Every day | ORAL | Status: AC

## 2013-02-10 MED ORDER — ACETAMINOPHEN 325 MG PO TABS: 650.0000 mg | ORAL_TABLET | Freq: Four times a day (QID) | ORAL | Status: DC | PRN

## 2013-02-10 MED ORDER — DOCUSATE SODIUM 100 MG PO CAPS: 100.0000 mg | ORAL_CAPSULE | Freq: Two times a day (BID) | ORAL | Status: AC

## 2013-02-10 MED ORDER — ENSURE COMPLETE PO LIQD: 237.0000 mL | ORAL | Status: DC

## 2013-02-10 NOTE — Care Management Note (Signed)
    Page 1 of 1   02/10/2013     3:17:50 PM   CARE MANAGEMENT NOTE 02/10/2013  Patient:  [facility]   Account Number:  [id number]  Date Initiated:  02/10/2013  Documentation initiated by:  Letha Cape  Subjective/Objective Assessment:   dx falls, uti, dehydration, htn, ha  admit     Action/Plan:   Anticipated DC Date:  02/10/2013   Anticipated DC Plan:  SKILLED NURSING FACILITY  In-house referral  Clinical Social Worker      DC Planning Services  CM consult      Choice offered to / List presented to:             Status of service:  Completed, signed off Medicare Important Message given?   (If response is "NO", the following Medicare IM given date fields will be blank) Date Medicare IM given:   Date Additional Medicare IM given:    Discharge Disposition:  SKILLED NURSING FACILITY  Per UR Regulation:  Reviewed for med. necessity/level of care/duration of stay  If discussed at Long Length of Stay Meetings, dates discussed:    Comments:

## 2013-02-10 NOTE — Clinical Social Work Placement (Signed)
Clinical Social Work Department CLINICAL SOCIAL WORK PLACEMENT NOTE 02/10/2013  Patient:  [facility]  Account Number:  [id number] Admit date:  02/06/2013  Clinical Social Worker:  Cherre Blanc, [place]  Date/time:  02/08/2013 04:09 PM  Clinical Social Work is seeking post-discharge placement for this patient at the following level of care:   SKILLED NURSING   (*CSW will update this form in Epic as items are completed)   02/08/2013  Patient/family provided with [facility] Department of Clinical Social Work's list of facilities offering this level of care within the geographic area requested by the patient (or if unable, by the patient's family).  02/08/2013  Patient/family informed of their freedom to choose among providers that offer the needed level of care, that participate in Medicare, Medicaid or managed care program needed by the patient, have an available bed and are willing to accept the patient.  02/08/2013  Patient/family informed of [facility]' ownership interest in [facility], as well as of the fact that they are under no obligation to receive care at this facility.  PASARR submitted to EDS on  PASARR number received from EDS on   FL2 transmitted to all facilities in geographic area requested by pt/family on  02/08/2013 FL2 transmitted to all facilities within larger geographic area on   Patient informed that his/her managed care company has contracts with or will negotiate with  certain facilities, including the following:     Patient/family informed of bed offers received:  02/08/2013 Patient chooses bed at [facility] Physician recommends and patient chooses bed at    Patient to be transferred to [facility] on  02/10/2013 Patient to be transferred to facility by Ambulance  The following physician request were entered in Epic:   Additional Comments: Per MD patient ready for 02/10/13. Patient DC to [facility] 02/10/13. Ambulance transport setup for patient. Son, daughter inlaw, RN and facility notified of patient's DC. DC packet left with patient's shadow chart. RN called facility to give report. CSW signing off.   Roddie Mc, [place], [place], [phone]

## 2013-02-10 NOTE — Progress Notes (Signed)
RN tried to get b/p on patient on more time and pt refused. She pulled of the b/p cuff and in doing so- pulled out her IV. RN already asked MD to put in care order concerning IV restart because pt will not let us touch her. She is sleeping at the moment and has asked to be left alone. Will continue to monitor

## 2013-02-10 NOTE — Discharge Summary (Signed)
Physician Discharge Summary  Bahja Bence MWU:[id number] DOB: January 17, 1906 DOA: 02/07/2013  PCP: Laurena Slimmer, MD  Admit date: 02/07/2013 Discharge date: 02/10/2013  Time spent: 60 minutes  Recommendations for Outpatient Follow-up:  Please check bmet and cbc in 1 week. Patient with hemorrhoids and rectal prolapse.  Needs bowel regimen for soft stools. Physical therapy at Skilled facility. Fall and aspiration precautions Recheck U/A in 2 weeks.  Discharge Diagnoses:  Principal Problem:   Falls Active Problems:   Hypertension   Anemia   UTI (lower urinary tract infection)   Dehydration   Discharge Condition: stable. Deconditioned. Very hard of hearing.  Diet recommendation: soft general diet.  Filed Weights   02/07/13 0453  Weight: 43.092 kg (95 lb)    History of present illness:  Kara [name] is a 79 y.o. female who is very hard of hearing with history of hypertension was brought to the ER after patient had 2 falls yesterday. Patient lives alone but is closely monitored by her son. Her son usually monitors her through the camera and found that patient had 2 falls yesterday. Both times patient fell on her back did not hit her head or lose consciousness. She fell after being entangled in the walker she uses. In the ER she complains of pain in the right hip x-rays were negative for any fracture. Presently on my exam patient denies any pain and on moving the extremities has no pain. Patient's son is concerned about her safety and wants placement. Labs show anemia and possible UTI. Patient also has mildly elevated creatinine from baseline.    Hospital Course:  Dehydration with generalized weakness and falls  -Ms. Migliaccio is in excellent condition for her age. -stable post IVF.   All diuretics have been discontinued this admission. -Patient has become weak and deconditioned.  Increase activity. PT recommends SNF.  -Hip x-rays are stable and so his chest x-ray no signs of  acute bony injuries.   Underlying Dementia (Hard of hearing)  - Patient continues to be at risk for delirium due to advanced age and probably some senile dementia in the light of her age.  - Fall and aspiration precautions. High fall risk and repeatedly tries to get out of bed to get up and move around.  UTI  - treated with 3 days of IV Rocephin.  Antibiotic course complete. - Creatinine has returned to baseline.  Anemia of chronic disease.  - exacerbated by hemorrhoids  - follow CBC.   Constipation with significant hemorrhoids and rectal prolaps -Stool softeners, Fleet enema, Suppository PRN  -Proctofoam x 2 weeks for hemorrhoids -Patient in the habit of manually disimpacting herself every morning - which has likely worsened her hemorrhoids and prolapse.  Significant deconditioning  -SNF at discharge for physical therapy.  Hypertension with sinus bradycardia  -stopped HCTZ -stopped beta blocker due to underlying sinus bradycardia  -remained on home dose ARB and monitor.  -Telemetry monitoring  -stable TSH  Lab Results   Component  Value  Date    TSH  1.573  02/07/2013     Code Status:  Made a DNR this admission  Discharge Exam: Filed Vitals:   02/09/13 1416  BP: 130/53  Pulse: 109  Temp: 97.5 F (36.4 C)  Resp: 18    General: Thin, well developed elderly female, very hard of hearing, very pleasant. Cardiovascular: rrr no m/r/g.  No lower ext edema Respiratory: cta, no w/c/r, no accessory muscle movement Abdomen:  Thin, soft, nt, nd, +bs, no masses Ext:  symmetric strength, 5/5 in each ext.  Psych:  Awake, Alert, Quite sharp when she can hear what is being said.  Discharge Instructions      Discharge Orders   Future Orders Complete By Expires   Diet general  As directed    Increase activity slowly  As directed        Medication List    STOP taking these medications       nadolol 20 MG tablet  Commonly known as:  CORGARD     potassium chloride 10  MEQ CR tablet  Commonly known as:  KLOR-CON     triamterene-hydrochlorothiazide 37.5-25 MG per tablet  Commonly known as:  MAXZIDE-25      TAKE these medications       acetaminophen 325 MG tablet  Commonly known as:  TYLENOL  Take 2 tablets (650 mg total) by mouth every 6 (six) hours as needed.     aspirin 81 MG EC tablet  Take 81 mg by mouth daily.     docusate sodium 100 MG capsule  Commonly known as:  COLACE  Take 1 capsule (100 mg total) by mouth 2 (two) times daily. Hold if diarrhea develops     feeding supplement (ENSURE COMPLETE) Liqd  Take 237 mLs by mouth daily.     hydrocortisone-pramoxine rectal foam  Commonly known as:  PROCTOFOAM-HC  Place 1 applicator rectally 3 (three) times daily. For hemorrhoids with rectal prolaps     INTEGRA PLUS Caps  Take 1 capsule by mouth every Monday, Wednesday, and Friday. Monday Wednesday and friday     polyethylene glycol packet  Commonly known as:  MIRALAX / GLYCOLAX  Take 17 g by mouth daily.     valsartan 160 MG tablet  Commonly known as:  DIOVAN  Take 160 mg by mouth daily.       No Known Allergies    The results of significant diagnostics from this hospitalization (including imaging, microbiology, ancillary and laboratory) are listed below for reference.    Significant Diagnostic Studies: Dg Chest 1 View  02/07/2013   *RADIOLOGY REPORT*  Clinical Data: Status post fall; concern for chest injury.  CHEST - 1 VIEW  Comparison: Chest radiograph performed 09/25/2008  Findings: The lungs are well expanded.  Vascular congestion is noted.  No pleural effusion or pneumothorax is seen.  Vague left midlung density is thought to reflect overlying soft tissues.  The cardiomediastinal silhouette is mildly enlarged.  No acute osseous abnormalities are identified.  IMPRESSION: Vascular congestion noted; lungs remain grossly clear. Displaced rib fractures seen.   Original Report Authenticated By: Tonia Ghent, M.D.   Dg Hip Complete  Right  02/07/2013   *RADIOLOGY REPORT*  Clinical Data: Status post fall; right hip pain.  RIGHT HIP - COMPLETE 2+ VIEW  Comparison: Right hip radiographs performed 04/11/2006  Findings: There is no evidence of fracture or dislocation.  The patient's right hip arthroplasty appears grossly intact, without evidence of loosening.  The proximal right femur appears intact. Mild degenerative change is noted at the lower lumbar spine.  The sacroiliac joints are unremarkable in appearance.  The visualized bowel gas pattern is grossly unremarkable in appearance.  Scattered phleboliths are noted within the pelvis.  A calcified fibroid is noted.  IMPRESSION:  1.  No evidence of fracture or dislocation. 2.  Right hip arthroplasty appears grossly intact, without evidence of loosening.   Original Report Authenticated By: Tonia Ghent, M.D.    Microbiology: Recent Results (from the past 240  hour(s))  URINE CULTURE     Status: None   Collection Time    02/07/13  2:06 AM      Result Value Range Status   Specimen Description URINE, CLEAN CATCH   Final   Special Requests CX ADDED AT 0229 ON [id number]   Final   Culture  Setup Time     Final   Value: 02/07/2013 02:49     Performed at [facility]   Colony Count     Final   Value: >=100,000 COLONIES/ML     Performed at [facility]   Culture     Final   Value: DIPHTHEROIDS(CORYNEBACTERIUM SPECIES)     Note: Standardized susceptibility testing for this organism is not available.     Performed at [facility]   Report Status 02/08/2013 FINAL   Final     Labs: Basic Metabolic Panel:  Recent Labs Lab 02/06/13 2210 02/07/13 0530  NA 137 140  K 4.7 4.4  CL 105 107  CO2 24 25  GLUCOSE 79 87  BUN 28* 26*  CREATININE 1.31* 1.20*  CALCIUM 8.9 8.9   Liver Function Tests:  Recent Labs Lab 02/06/13 2210  AST 34  ALT 15  ALKPHOS 37*  BILITOT 0.6  PROT 6.9  ALBUMIN 2.8*   CBC:  Recent Labs Lab 02/06/13 2210 02/07/13 0530   WBC 6.2 6.2  HGB 9.2* 9.6*  HCT 27.8* 29.2*  MCV 91.1 90.7  PLT 205 213     SignedConley Canal [phone]  [facility] 02/10/2013, 8:26 AM

## 2013-02-10 NOTE — Discharge Summary (Signed)
Addendum  Patient seen and examined, chart and data base reviewed.  I agree with the above assessment and plan.  For full details please see Mrs. Algis Downs PA note.   UTI and dehydration. Has also generalized weakness and falls, to SNF.   Clint Lipps, MD [facility] Pager: [phone] 02/10/2013, 11:29 AM

## 2013-02-10 NOTE — Progress Notes (Signed)
Pt refused morning vitals. Pt is at nurses station- she is alert, denies pain, SOB, or chills. RN will continue to monitor

## 2013-02-10 NOTE — Progress Notes (Signed)
Report called to [facility]. 

## 2013-02-11 ENCOUNTER — Non-Acute Institutional Stay (SKILLED_NURSING_FACILITY): Payer: Medicare Other | Admitting: Internal Medicine

## 2013-02-11 DIAGNOSIS — N390 Urinary tract infection, site not specified: Secondary | ICD-10-CM

## 2013-02-11 DIAGNOSIS — F0390 Unspecified dementia without behavioral disturbance: Secondary | ICD-10-CM

## 2013-02-11 DIAGNOSIS — I10 Essential (primary) hypertension: Secondary | ICD-10-CM

## 2013-02-11 DIAGNOSIS — D638 Anemia in other chronic diseases classified elsewhere: Secondary | ICD-10-CM

## 2013-02-15 ENCOUNTER — Ambulatory Visit: Payer: Self-pay | Admitting: Podiatry

## 2013-02-18 ENCOUNTER — Non-Acute Institutional Stay (SKILLED_NURSING_FACILITY): Payer: Medicare Other | Admitting: Internal Medicine

## 2013-02-18 DIAGNOSIS — F0390 Unspecified dementia without behavioral disturbance: Secondary | ICD-10-CM

## 2013-02-18 DIAGNOSIS — D638 Anemia in other chronic diseases classified elsewhere: Secondary | ICD-10-CM

## 2013-03-09 DIAGNOSIS — D638 Anemia in other chronic diseases classified elsewhere: Secondary | ICD-10-CM | POA: Insufficient documentation

## 2013-03-09 DIAGNOSIS — F0390 Unspecified dementia without behavioral disturbance: Secondary | ICD-10-CM | POA: Insufficient documentation

## 2013-03-09 NOTE — Progress Notes (Signed)
Patient ID: Kara [name], female   DOB: August 20, 1905, 79 y.o.   MRN: [id number]        HISTORY & PHYSICAL  DATE: 02/11/2013   FACILITY: [facility]  LEVEL OF CARE: SNF (31)  ALLERGIES:  No Known Allergies  CHIEF COMPLAINT:  Manage hypertension, dementia, and anemia of chronic disease.    HISTORY OF PRESENT ILLNESS:  The patient is a 79 year-old, African-American female who was hospitalized for recurrent falls.  After hospitalization, she is admitted to this facility for short-term rehabilitation.  She has the following problems:    DEMENTIA:  Advanced.  The patient is disoriented and does not follow commands.  The dementia remains stable and continues to function adequately in the current living environment with supervision.  The patient has had little changes in behavior. No complications noted from the medications presently being used.    HTN: Pt 's HTN remains stable.  Staff denies CP, sob, DOE, pedal edema, headaches, dizziness or visual disturbances.  No complications from the medications currently being used.  Last BP :   119/65.    ANEMIA: The anemia has been stable. The staff denies fatigue, melena or hematochezia.  The patient is currently not on iron.    Last hemoglobins are: 9.6, 9.2.    PAST MEDICAL HISTORY :  Past Medical History  Diagnosis Date  . Dementia   . Hypertension   . Diabetes mellitus     PAST SURGICAL HISTORY: Past Surgical History  Procedure Laterality Date  . Hip fracture surgery      SOCIAL HISTORY:  reports that she has never smoked. She does not have any smokeless tobacco history on file. She reports that she does not drink alcohol or use illicit drugs.  FAMILY HISTORY: None  CURRENT MEDICATIONS: Reviewed per [facility]  REVIEW OF SYSTEMS:   Unobtainable due to dementia and severe hearing loss.     PHYSICAL EXAMINATION  VS:  T 97.4       P 64      RR 16      BP 119/65      POX%        WT (Lb)  GENERAL: no acute distress,  normal body habitus EYES: conjunctivae normal, sclerae normal, normal eye lids MOUTH/THROAT: unable to assess   NECK: supple, trachea midline, no neck masses, no thyroid tenderness, no thyromegaly LYMPHATICS: no LAN in the neck, no supraclavicular LAN RESPIRATORY: breathing is even & unlabored, BS CTAB CARDIAC: RRR, no murmur,no extra heart sounds, no edema GI:  ABDOMEN: abdomen soft, normal BS, no masses, no tenderness  LIVER/SPLEEN: no hepatomegaly, no splenomegaly MUSCULOSKELETAL: unable to assess   PSYCHIATRIC: the patient is alert, disoriented, affect & behavior appropriate  LABS/RADIOLOGY: Right hip x-ray:  No acute findings.    Chest x-ray:  No acute disease.    Urine culture grew diphtheroids significantly.    BUN 26, creatinine 1.2, otherwise BMP normal.    Albumin 2.8, otherwise liver profile normal.    Hemoglobin 9.6, MCV 90.7, otherwise CBC normal.    ASSESSMENT/PLAN:  Hypertension.  Well controlled.    Dementia.  Very advanced.    Anemia of chronic disease.  Reassess hemoglobin level.    UTI.  The patient was treated in the hospital.    Constipation.  Continue current laxatives.    Acute renal failure.  The patient was given IV fluids in the hospital.  Reassess renal functions.    Check CBC and BMP.  I have reviewed patient's medical records received at admission/from hospitalization.  CPT CODE: 09811

## 2013-03-15 NOTE — Progress Notes (Signed)
Patient ID: Kara [name], female   DOB: 07-25-1905, 79 y.o.   MRN: [id number]        PROGRESS NOTE  DATE: 02/18/2013  FACILITY:  [facility]  LEVEL OF CARE: SNF (31)  Acute Visit  CHIEF COMPLAINT:  Manage dementia with agitation.    HISTORY OF PRESENT ILLNESS: I was requested by the staff to assess the patient regarding above problem(s):  Staff report that patient has been screaming and becoming very aggressive.  Patient does not follow commands due to advanced dementia.    PAST MEDICAL HISTORY : Reviewed.  No changes.  CURRENT MEDICATIONS: Reviewed per [facility]  REVIEW OF SYSTEMS:  Unobtainable due to dementia.        PHYSICAL EXAMINATION  GENERAL: no acute distress, normal body habitus NECK: supple, trachea midline, no neck masses, no thyroid tenderness, no thyromegaly RESPIRATORY: breathing is even & unlabored, BS CTAB CARDIAC: RRR, no murmur,no extra heart sounds, no edema GI: abdomen soft, normal BS, no masses, no tenderness, no hepatomegaly, no splenomegaly PSYCHIATRIC: the patient is alert, disoriented, affect & behavior appropriate  LABS/RADIOLOGY: On 02/16/2013:  Hemoglobin 9.3, MCV 94.    On 02/07/2013:  Hemoglobin 9.6.    ASSESSMENT/PLAN:  Anemia of chronic disease.  Hemoglobin stable.    Dementia with agitation.  Unstable problem.  Start Seroquel 25 mg q.d.  Start lorazepam 0.5 mg b.i.d. p.r.n.     CPT CODE: 40981

## 2013-03-23 ENCOUNTER — Encounter: Payer: Self-pay | Admitting: Internal Medicine

## 2013-03-23 ENCOUNTER — Non-Acute Institutional Stay (SKILLED_NURSING_FACILITY): Payer: Medicare Other | Admitting: Internal Medicine

## 2013-03-23 DIAGNOSIS — D638 Anemia in other chronic diseases classified elsewhere: Secondary | ICD-10-CM

## 2013-03-23 DIAGNOSIS — I10 Essential (primary) hypertension: Secondary | ICD-10-CM

## 2013-03-23 DIAGNOSIS — K5900 Constipation, unspecified: Secondary | ICD-10-CM | POA: Insufficient documentation

## 2013-03-23 DIAGNOSIS — F0390 Unspecified dementia without behavioral disturbance: Secondary | ICD-10-CM

## 2013-03-23 NOTE — Progress Notes (Signed)
       PROGRESS NOTE  DATE: 03/23/2013  FACILITY: Nursing Home Location: [facility]  LEVEL OF CARE: SNF (31)  Routine Visit  CHIEF COMPLAINT:  Manage anemia of chronic disease, dementia and hypertension  HISTORY OF PRESENT ILLNESS:  REASSESSMENT OF ONGOING PROBLEM(S):  DEMENTIA: The dementia remaines stable and continues to function adequately in the current living environment with supervision.  The patient has had little changes in behavior. No complications noted from the medications presently being used. Advanced. Patient is a poor historian.  ANEMIA: The anemia has been stable. The patient denies fatigue, melena or hematochezia.  On 03-19-13 hemoglobin 9, MCV 94. On 02-16-13 hemoglobin 9.3. On 02-07-13 hemoglobin 9.6. Patient is currently not on iron.  HTN: Pt 's HTN remains stable.  Denies CP, sob, DOE, pedal edema, headaches, dizziness or visual disturbances.  No complications from the medications currently being used.  Last BP : 114/82.  PAST MEDICAL HISTORY : Reviewed.  No changes.  CURRENT MEDICATIONS: Reviewed per [facility]  REVIEW OF SYSTEMS: Unobtainable due to dementia   PHYSICAL EXAMINATION  VS:  T 98.2      P 64      RR 18      BP 114/82     POX %     WT (Lb) 90  GENERAL: no acute distress, thin body habitus EYES: conjunctivae normal, sclerae normal, normal eye lids NECK: supple, trachea midline, no neck masses, no thyroid tenderness, no thyromegaly LYMPHATICS: no LAN in the neck, no supraclavicular LAN RESPIRATORY: breathing is even & unlabored, BS CTAB CARDIAC: RRR, no murmur,no extra heart sounds, no edema GI: abdomen soft, normal BS, no masses, no tenderness, no hepatomegaly, no splenomegaly PSYCHIATRIC: the patient is alert & oriented to person, affect & behavior appropriate  LABS/RADIOLOGY:  11-14 hemoglobin 9, MCV 94 otherwise CBC normal, glucose 124 otherwise BMP normal  ASSESSMENT/PLAN:  Anemia of chronic disease-hemoglobin  declined. We monitor. Dementia-check TSH and vitamin B12 level Hypertension-well-controlled Constipation-continue current laxatives. Check liver profile, Depakote level and hemoglobin A1c  CPT CODE: 40981

## 2013-04-13 ENCOUNTER — Non-Acute Institutional Stay (SKILLED_NURSING_FACILITY): Payer: Medicare Other | Admitting: Internal Medicine

## 2013-04-13 ENCOUNTER — Encounter: Payer: Self-pay | Admitting: Internal Medicine

## 2013-04-13 DIAGNOSIS — F0390 Unspecified dementia without behavioral disturbance: Secondary | ICD-10-CM

## 2013-04-13 DIAGNOSIS — I10 Essential (primary) hypertension: Secondary | ICD-10-CM

## 2013-04-13 DIAGNOSIS — D638 Anemia in other chronic diseases classified elsewhere: Secondary | ICD-10-CM

## 2013-04-13 DIAGNOSIS — K5900 Constipation, unspecified: Secondary | ICD-10-CM

## 2013-04-13 NOTE — Progress Notes (Signed)
       PROGRESS NOTE  DATE: 04-13-13  FACILITY: Nursing Home Location: [facility]  LEVEL OF CARE: SNF (31)  Routine Visit  CHIEF COMPLAINT:  Manage anemia of chronic disease, dementia and hypertension  HISTORY OF PRESENT ILLNESS:  REASSESSMENT OF ONGOING PROBLEM(S):  DEMENTIA: The dementia remaines stable and continues to function adequately in the current living environment with supervision.  The patient has had little changes in behavior. No complications noted from the medications presently being used. Advanced. Patient is a poor historian.  ANEMIA: The anemia has been stable. The patient denies fatigue, melena or hematochezia.  On 03-19-13 hemoglobin 9, MCV 94. On 02-16-13 hemoglobin 9.3. On 02-07-13 hemoglobin 9.6. Patient is currently not on iron.  HTN: Pt 's HTN remains stable.  Denies CP, sob, DOE, pedal edema, headaches, dizziness or visual disturbances.  No complications from the medications currently being used.  Last BP : 114/82, 126/66.  PAST MEDICAL HISTORY : Reviewed.  No changes.  CURRENT MEDICATIONS: Reviewed per [facility]  REVIEW OF SYSTEMS: Unobtainable due to dementia   PHYSICAL EXAMINATION  VS:  T 99.3      P 75      RR 18      BP 126/66    POX %     WT (Lb) 95  GENERAL: no acute distress, thin body habitus NECK: supple, trachea midline, no neck masses, no thyroid tenderness, no thyromegaly RESPIRATORY: breathing is even & unlabored, BS CTAB CARDIAC: RRR, no murmur,no extra heart sounds, no edema GI: abdomen soft, normal BS, no masses, no tenderness, no hepatomegaly, no splenomegaly PSYCHIATRIC: the patient is alert & oriented to person, affect & behavior appropriate  LABS/RADIOLOGY:  11-14 hemoglobin 9, MCV 94 otherwise CBC normal, glucose 124 otherwise BMP normal, total protein 5.9, albumin 2.7 otherwise liver profile normal, vitamin B12 level 1189, folate greater than 19.9, hemoglobin A1c 5.5, TSH 2.39, Depakote level  8  ASSESSMENT/PLAN:  Anemia of chronic disease-hemoglobin declined. Will monitor. Dementia-advanced Hypertension-well-controlled Constipation-continue current laxatives.  CPT CODE: 47829

## 2013-05-11 ENCOUNTER — Non-Acute Institutional Stay (SKILLED_NURSING_FACILITY): Payer: Medicare Other | Admitting: Internal Medicine

## 2013-05-11 DIAGNOSIS — D638 Anemia in other chronic diseases classified elsewhere: Secondary | ICD-10-CM

## 2013-05-11 DIAGNOSIS — F0390 Unspecified dementia without behavioral disturbance: Secondary | ICD-10-CM

## 2013-05-11 DIAGNOSIS — K5900 Constipation, unspecified: Secondary | ICD-10-CM

## 2013-05-11 DIAGNOSIS — I10 Essential (primary) hypertension: Secondary | ICD-10-CM

## 2013-05-11 NOTE — Progress Notes (Signed)
              PROGRESS NOTE  DATE: 05-11-13  FACILITY: Nursing Home [facility]  LEVEL OF CARE: SNF (31)  Routine Visit  CHIEF COMPLAINT:  Manage anemia of chronic disease, dementia and hypertension  HISTORY OF PRESENT ILLNESS:  REASSESSMENT OF ONGOING PROBLEM(S):  DEMENTIA: The dementia remaines stable and continues to function adequately in the current living environment with supervision.  The patient has had little changes in behavior. No complications noted from the medications presently being used. Advanced. Patient is a poor historian. I was requested by the pharmacy consultant to assess the patient for continued need for Seroquel.  ANEMIA: The anemia has been stable. The staff deny fatigue, melena or hematochezia.  On 03-19-13 hemoglobin 9, MCV 94. On 02-16-13 hemoglobin 9.3. On 02-07-13 hemoglobin 9.6. Patient is currently not on iron.  HTN: Pt 's HTN remains stable.  Staff Deny CP, sob, DOE, pedal edema, headaches, dizziness or visual disturbances.  No complications from the medications currently being used.  Last BP : 114/82, 126/66, 134/60.  PAST MEDICAL HISTORY : Reviewed.  No changes.  [facility]  REVIEW OF SYSTEMS: Unobtainable due to dementia   PHYSICAL EXAMINATION  VS:  T 98.5      P 74      RR 18      BP 134/60    POX %     WT (Lb) 95  GENERAL: no acute distress, thin body habitus EYES: Unable to assess NECK: supple, trachea midline, no neck masses, no thyroid tenderness, no thyromegaly LYMPHATICS: No cervical lymphadenopathy, no supraclavicular lymphadenopathy RESPIRATORY: breathing is even & unlabored, BS CTAB CARDIAC: RRR, no murmur,no extra heart sounds, no edema GI: abdomen soft, normal BS, no masses, no tenderness, no hepatomegaly, no splenomegaly PSYCHIATRIC: the patient is alert & disoriented, affect depressed, combative  LABS/RADIOLOGY:  11-14 hemoglobin 9, MCV 94 otherwise CBC normal, glucose  124 otherwise BMP normal, total protein 5.9, albumin 2.7 otherwise liver profile normal, vitamin B12 level 1189, folate greater than 19.9, hemoglobin A1c 5.5, TSH 2.39, Depakote level 8  ASSESSMENT/PLAN:  Anemia of chronic disease-hemoglobin declined. Will monitor. Dementia-advanced.  Patient is having hallucinations therefore use of Seroquel is medically justified. Depakote was increased. Hypertension-well-controlled Constipation-continue current laxatives. Check Depakote level  CPT CODE: [id number]

## 2013-05-21 ENCOUNTER — Emergency Department (HOSPITAL_COMMUNITY): Payer: Medicare Other

## 2013-05-21 ENCOUNTER — Inpatient Hospital Stay (HOSPITAL_COMMUNITY)
Admission: EM | Admit: 2013-05-21 | Discharge: 2013-05-30 | DRG: 186 | Disposition: E | Payer: Medicare Other | Attending: Internal Medicine | Admitting: Internal Medicine

## 2013-05-21 ENCOUNTER — Encounter (HOSPITAL_COMMUNITY): Payer: Self-pay | Admitting: Emergency Medicine

## 2013-05-21 DIAGNOSIS — E41 Nutritional marasmus: Secondary | ICD-10-CM | POA: Diagnosis present

## 2013-05-21 DIAGNOSIS — J189 Pneumonia, unspecified organism: Secondary | ICD-10-CM | POA: Diagnosis present

## 2013-05-21 DIAGNOSIS — Z66 Do not resuscitate: Secondary | ICD-10-CM | POA: Diagnosis present

## 2013-05-21 DIAGNOSIS — E870 Hyperosmolality and hypernatremia: Secondary | ICD-10-CM | POA: Diagnosis present

## 2013-05-21 DIAGNOSIS — D638 Anemia in other chronic diseases classified elsewhere: Secondary | ICD-10-CM

## 2013-05-21 DIAGNOSIS — J690 Pneumonitis due to inhalation of food and vomit: Secondary | ICD-10-CM

## 2013-05-21 DIAGNOSIS — J90 Pleural effusion, not elsewhere classified: Principal | ICD-10-CM | POA: Diagnosis present

## 2013-05-21 DIAGNOSIS — R627 Adult failure to thrive: Secondary | ICD-10-CM | POA: Diagnosis present

## 2013-05-21 DIAGNOSIS — J9600 Acute respiratory failure, unspecified whether with hypoxia or hypercapnia: Secondary | ICD-10-CM | POA: Diagnosis present

## 2013-05-21 DIAGNOSIS — Z7982 Long term (current) use of aspirin: Secondary | ICD-10-CM

## 2013-05-21 DIAGNOSIS — IMO0002 Reserved for concepts with insufficient information to code with codable children: Secondary | ICD-10-CM

## 2013-05-21 DIAGNOSIS — R5383 Other fatigue: Secondary | ICD-10-CM

## 2013-05-21 DIAGNOSIS — Z515 Encounter for palliative care: Secondary | ICD-10-CM

## 2013-05-21 DIAGNOSIS — R0600 Dyspnea, unspecified: Secondary | ICD-10-CM

## 2013-05-21 DIAGNOSIS — R5381 Other malaise: Secondary | ICD-10-CM | POA: Diagnosis not present

## 2013-05-21 DIAGNOSIS — H9190 Unspecified hearing loss, unspecified ear: Secondary | ICD-10-CM | POA: Diagnosis present

## 2013-05-21 DIAGNOSIS — I10 Essential (primary) hypertension: Secondary | ICD-10-CM | POA: Diagnosis present

## 2013-05-21 DIAGNOSIS — R0902 Hypoxemia: Secondary | ICD-10-CM | POA: Diagnosis present

## 2013-05-21 DIAGNOSIS — N390 Urinary tract infection, site not specified: Secondary | ICD-10-CM

## 2013-05-21 DIAGNOSIS — R532 Functional quadriplegia: Secondary | ICD-10-CM | POA: Diagnosis present

## 2013-05-21 DIAGNOSIS — E119 Type 2 diabetes mellitus without complications: Secondary | ICD-10-CM | POA: Diagnosis present

## 2013-05-21 DIAGNOSIS — D649 Anemia, unspecified: Secondary | ICD-10-CM

## 2013-05-21 DIAGNOSIS — F0390 Unspecified dementia without behavioral disturbance: Secondary | ICD-10-CM | POA: Diagnosis present

## 2013-05-21 DIAGNOSIS — Z79899 Other long term (current) drug therapy: Secondary | ICD-10-CM

## 2013-05-21 HISTORY — DX: Muscle weakness (generalized): M62.81

## 2013-05-21 HISTORY — DX: Unspecified hearing loss, unspecified ear: H91.90

## 2013-05-21 HISTORY — DX: Anemia, unspecified: D64.9

## 2013-05-21 LAB — COMPREHENSIVE METABOLIC PANEL
ALT: 14 U/L (ref 0–35)
AST: 22 U/L (ref 0–37)
Albumin: 2.1 g/dL — ABNORMAL LOW (ref 3.5–5.2)
Alkaline Phosphatase: 66 U/L (ref 39–117)
BILIRUBIN TOTAL: 0.3 mg/dL (ref 0.3–1.2)
BUN: 35 mg/dL — ABNORMAL HIGH (ref 6–23)
CO2: 31 mEq/L (ref 19–32)
Calcium: 8.5 mg/dL (ref 8.4–10.5)
Chloride: 110 mEq/L (ref 96–112)
Creatinine, Ser: 0.99 mg/dL (ref 0.50–1.10)
GFR calc Af Amer: 51 mL/min — ABNORMAL LOW (ref >90)
GFR, EST NON AFRICAN AMERICAN: 44 mL/min — AB (ref >90)
Glucose, Bld: 112 mg/dL — ABNORMAL HIGH (ref 70–99)
Potassium: 4.6 mEq/L (ref 3.7–5.3)
Sodium: 147 mEq/L (ref 137–147)
TOTAL PROTEIN: 7.6 g/dL (ref 6.0–8.3)

## 2013-05-21 LAB — URINALYSIS, ROUTINE W REFLEX MICROSCOPIC
BILIRUBIN URINE: NEGATIVE
GLUCOSE, UA: NEGATIVE mg/dL
KETONES UR: NEGATIVE mg/dL
Nitrite: NEGATIVE
PROTEIN: 30 mg/dL — AB
Specific Gravity, Urine: 1.022 (ref 1.005–1.030)
UROBILINOGEN UA: 1 mg/dL (ref 0.0–1.0)
pH: 6 (ref 5.0–8.0)

## 2013-05-21 LAB — CBC WITH DIFFERENTIAL/PLATELET
Basophils Absolute: 0 10*3/uL (ref 0.0–0.1)
Basophils Relative: 1 % (ref 0–1)
EOS ABS: 0 10*3/uL (ref 0.0–0.7)
Eosinophils Relative: 0 % (ref 0–5)
HCT: 33.5 % — ABNORMAL LOW (ref 36.0–46.0)
Hemoglobin: 10.5 g/dL — ABNORMAL LOW (ref 12.0–15.0)
LYMPHS PCT: 10 % — AB (ref 12–46)
Lymphs Abs: 0.7 10*3/uL (ref 0.7–4.0)
MCH: 29.2 pg (ref 26.0–34.0)
MCHC: 31.3 g/dL (ref 30.0–36.0)
MCV: 93.1 fL (ref 78.0–100.0)
Monocytes Absolute: 0.4 10*3/uL (ref 0.1–1.0)
Monocytes Relative: 6 % (ref 3–12)
NEUTROS ABS: 5.8 10*3/uL (ref 1.7–7.7)
Neutrophils Relative %: 84 % — ABNORMAL HIGH (ref 43–77)
PLATELETS: 337 10*3/uL (ref 150–400)
RBC: 3.6 MIL/uL — ABNORMAL LOW (ref 3.87–5.11)
RDW: 15.5 % (ref 11.5–15.5)
WBC: 6.9 10*3/uL (ref 4.0–10.5)

## 2013-05-21 LAB — BLOOD GAS, ARTERIAL
Acid-Base Excess: 5.5 mmol/L — ABNORMAL HIGH (ref 0.0–2.0)
Bicarbonate: 31.2 mEq/L — ABNORMAL HIGH (ref 20.0–24.0)
DRAWN BY: 308601
O2 CONTENT: 2 L/min
O2 Saturation: 96.3 %
PCO2 ART: 54.9 mmHg — AB (ref 35.0–45.0)
Patient temperature: 98.6
TCO2: 29.1 mmol/L (ref 0–100)
pH, Arterial: 7.373 (ref 7.350–7.450)
pO2, Arterial: 91.8 mmHg (ref 80.0–100.0)

## 2013-05-21 LAB — URINE MICROSCOPIC-ADD ON

## 2013-05-21 LAB — TROPONIN I

## 2013-05-21 MED ORDER — DEXTROSE 5 % IV SOLN
1.0000 g | INTRAVENOUS | Status: DC
  Administered 2013-05-21 – 2013-05-23 (×3): 1 g via INTRAVENOUS
  Filled 2013-05-21 (×4): qty 1

## 2013-05-21 MED ORDER — ACETAMINOPHEN 325 MG RE SUPP
325.0000 mg | Freq: Once | RECTAL | Status: DC
  Filled 2013-05-21: qty 1

## 2013-05-21 MED ORDER — VANCOMYCIN HCL 500 MG IV SOLR
500.0000 mg | INTRAVENOUS | Status: DC
  Administered 2013-05-21: 500 mg via INTRAVENOUS
  Filled 2013-05-21 (×2): qty 500

## 2013-05-21 NOTE — ED Provider Notes (Signed)
CSN: [id number]     Arrival date & time Aug 16, 2013  1849 History   First MD Initiated Contact with Patient 0Apr 20, 2015 1852     Chief Complaint  Patient presents with  . Possible Pneumonia    (Consider location/radiation/quality/duration/timing/severity/associated sxs/prior Treatment) HPI Comments: Patient is a [age] yo F PMHx significant for dementia, HTN, DM, Anemia, hearing loss presenting [facility] for two weeks of non-productive cough, increased fatigue and generalized weakness. No known fevers, vomiting, diarrhea. The nursing home physician and was willing to evaluate patient at nursing home. The family had requested that the patient come to the emergency department for evaluation. Patient is a level V caveat d/t dementia. Son, power of attorney is here, patient is DNR/DNI.    Past Medical History  Diagnosis Date  . Dementia   . Hypertension   . Diabetes mellitus   . Anemia, unspecified   . Hearing loss   . Generalized muscle weakness    Past Surgical History  Procedure Laterality Date  . Hip fracture surgery     History reviewed. No pertinent family history. History  Substance Use Topics  . Smoking status: Never Smoker   . Smokeless tobacco: Not on file  . Alcohol Use: No   OB History   Grav Para Term Preterm Abortions TAB SAB Ect Mult Living                 Review of Systems  Unable to perform ROS: Dementia    Allergies  Review of patient's allergies indicates no known allergies.  Home Medications   Current Outpatient Rx  Name  Route  Sig  Dispense  Refill  . acetaminophen (TYLENOL) 325 MG tablet   Oral   Take 650 mg by mouth every 6 (six) hours as needed for moderate pain or fever.         Marland Kitchen. aspirin 81 MG EC tablet   Oral   Take 81 mg by mouth daily.           . divalproex (DEPAKOTE SPRINKLE) 125 MG capsule   Oral   Take 250 mg by mouth 2 (two) times daily.         Marland Kitchen. docusate sodium (COLACE) 100 MG capsule   Oral   Take 1 capsule  (100 mg total) by mouth 2 (two) times daily. Hold if diarrhea develops   10 capsule   0   . FeFum-FePoly-FA-B Cmp-C-Biot (INTEGRA PLUS) CAPS   Oral   Take 1 capsule by mouth every Monday, Wednesday, and Friday.          . hydrocortisone-pramoxine (PROCTOFOAM-HC) rectal foam   Rectal   Place 1 applicator rectally 3 (three) times daily.         Marland Kitchen. lactose free nutrition (BOOST PLUS) LIQD   Oral   Take 237 mLs by mouth 2 (two) times daily.         Marland Kitchen. LORazepam (ATIVAN) 0.5 MG tablet   Oral   Take 0.5 mg by mouth 2 (two) times daily as needed for anxiety.         Marland Kitchen. losartan (COZAAR) 100 MG tablet   Oral   Take 100 mg by mouth daily.         . polyethylene glycol (MIRALAX / GLYCOLAX) packet   Oral   Take 17 g by mouth daily.   14 each   0   . QUEtiapine (SEROQUEL) 25 MG tablet   Oral   Take 25 mg  by mouth daily.          BP 101/49  Pulse 105  Temp(Src) 98.3 F (36.8 C) (Rectal)  Resp 25  Wt 100 lb (45.36 kg)  SpO2 100% Physical Exam  Constitutional: She appears well-developed and well-nourished.  HENT:  Head: Normocephalic and atraumatic.  Right Ear: External ear normal.  Left Ear: External ear normal.  Nose: Nose normal.  Eyes: Conjunctivae are normal.  Neck: Neck supple.  Cardiovascular: Regular rhythm, normal heart sounds and intact distal pulses.  Tachycardia present.   Pulmonary/Chest: She is in respiratory distress. She has decreased breath sounds in the left upper field, the left middle field and the left lower field. She has rales in the right lower field.  Abdominal: Soft. There is no tenderness.  Neurological: She is alert. GCS eye subscore is 3. GCS verbal subscore is 3. GCS motor subscore is 5.  Oriented to self (baseline)  Skin: Skin is warm and dry.     Two quarter sized stage II pressure ulcers above buttock One large 4cm x 4 cm tracking pressure ucler to right of gluteal cleft.  No drainage, bleeding, or surrounding erythema.     ED  Course  Procedures (including critical care time) Medications  acetaminophen (TYLENOL) suppository 325 mg (325 mg Rectal Not Given 05/17/2013 2132)  ceFEPIme (MAXIPIME) 1 g in dextrose 5 % 50 mL IVPB (0 g Intravenous Stopped 05/26/2013 2246)  vancomycin (VANCOCIN) 500 mg in sodium chloride 0.9 % 100 mL IVPB (500 mg Intravenous New Bag/Given 05/22/2013 2244)    Labs Review Labs Reviewed  URINALYSIS, ROUTINE W REFLEX MICROSCOPIC - Abnormal; Notable for the following:    APPearance CLOUDY (*)    Hgb urine dipstick MODERATE (*)    Protein, ur 30 (*)    Leukocytes, UA LARGE (*)    All other components within normal limits  CBC WITH DIFFERENTIAL - Abnormal; Notable for the following:    RBC 3.60 (*)    Hemoglobin 10.5 (*)    HCT 33.5 (*)    Neutrophils Relative % 84 (*)    Lymphocytes Relative 10 (*)    All other components within normal limits  COMPREHENSIVE METABOLIC PANEL - Abnormal; Notable for the following:    Glucose, Bld 112 (*)    BUN 35 (*)    Albumin 2.1 (*)    GFR calc non Af Amer 44 (*)    GFR calc Af Amer 51 (*)    All other components within normal limits  BLOOD GAS, ARTERIAL - Abnormal; Notable for the following:    pCO2 arterial 54.9 (*)    Bicarbonate 31.2 (*)    Acid-Base Excess 5.5 (*)    All other components within normal limits  URINE MICROSCOPIC-ADD ON - Abnormal; Notable for the following:    Bacteria, UA MANY (*)    All other components within normal limits  URINE CULTURE  CULTURE, BLOOD (ROUTINE X 2)  CULTURE, BLOOD (ROUTINE X 2)  TROPONIN I  INFLUENZA PANEL BY PCR (TYPE A & B, H1N1)   Imaging Review Dg Chest Port 1 View  05/17/2013   CLINICAL DATA:  Shortness of breath, history of hypertension  EXAM: PORTABLE CHEST - 1 VIEW  COMPARISON:  02/07/2013  FINDINGS: There is new near total opacification of the left hemi thorax with minimal residual aeration of left upper lobe. Mediastinal shift to the left is identified. Chronically prominent right lung markings are  reidentified without focal right-sided pulmonary parenchymal opacity. Probable prominence of  a vessel seen on-end incidentally noted over the right hilar region. Heart size cannot be evaluated. No acute osseous abnormality is identified.  IMPRESSION: New near total opacification of the left hemi thorax reflecting large pleural effusion and associated compressive atelectasis. The underlying lung is not visualized with the exception of minimally residually aerated left upper lobe.   Electronically Signed   By: Christiana Pellant M.D.   On: 05/20/2013 20:31    EKG Interpretation    Date/Time:  Friday May 21 2013 19:22:53 EST Ventricular Rate:  97 PR Interval:  204 QRS Duration: 75 QT Interval:  342 QTC Calculation: 434 R Axis:   -47 Text Interpretation:  Sinus rhythm Ventricular premature complex Probable left atrial enlargement Left anterior fascicular block Low voltage, precordial leads Abnormal R-wave progression, early transition Nonspecific T abnormalities, lateral leads Baseline wander in lead(s) V3 No significant change since last tracing Confirmed by HARRISON  MD, FORREST ([id number]) on 05/27/2013 7:37:36 PM            MDM   1. Hypoxia   2. Pleural effusion, left   3. Dementia   4. Hypertension   5. Pleural effusion    Patient presenting with low grade temperature, alert and oriented x 1 at baseline for patient. Patient tachypneic and hypoxic on room air. Tachypnea and hypoxia improved with three liters of nasal cannula. Rales appreciated in right lung fields with decreased breath sounds on left. I have reviewed nursing notes, vital signs, and all appropriate lab and imaging results for this patient. Will cover patient for HCAP with IV Vancomycin and Maxipime. Will admit patient to hospital for further management of pleural effusion and hypoxia with new oxygen requirement. Patient d/w with Dr. Romeo Apple, agrees with plan.        Jeannetta Ellis, PA-C 05/19/2013 2358

## 2013-05-21 NOTE — H&P (Addendum)
[facility] History and Physical  Kara [name] QVZ:[id number] DOB: February 29, 1906 DOA: 05/20/2013  Referring physician: ER physician. PCP: Laurena SlimmerLARK,PRESTON S, MD   Chief Complaint: Shortness of breath with hypoxia.  HPI: Kara [name] is a [age] y.o. female history of hypertension, difficulty hearing, dementia and anemia was transferred from the nursing home after patient was found to be hypoxic with shortness of breath. As per patient's son from whom I had obtained most of the history, patient has been having increasing cough with shortness of breath over the last 2 weeks. In the ER patient's chest x-ray shows complete opacification of the left chest from possible left pleural effusion. Patient at this time has been empirically started on antibiotics for health care associated pneumonia, given the history of shortness of breath or productive cough and fever. Patient's son feels that over the last few months patient's dementia has further worsened. Patient did not have any nausea vomiting abdominal pain or diarrhea. Patient was admitted few months ago for falls and at that time was placed in nursing home after discharge and during which patient's diuretics was discontinued due to dehydration and falls.  Review of Systems: As presented in the history of presenting illness, rest negative.  Past Medical History  Diagnosis Date  . Dementia   . Hypertension   . Diabetes mellitus   . Anemia, unspecified   . Hearing loss   . Generalized muscle weakness    Past Surgical History  Procedure Laterality Date  . Hip fracture surgery     Social History:  reports that she has never smoked. She does not have any smokeless tobacco history on file. She reports that she does not drink alcohol or use illicit drugs. Where does patient live nursing home. Can patient participate in ADLs? No.  No Known Allergies  Family History: History reviewed. No pertinent family history.    Prior to Admission  medications   Medication Sig Start Date End Date Taking? Authorizing Provider  acetaminophen (TYLENOL) 325 MG tablet Take 650 mg by mouth every 6 (six) hours as needed for moderate pain or fever. 02/10/13  Yes Tora KindredMarianne L York, PA-C  aspirin 81 MG EC tablet Take 81 mg by mouth daily.   08/13/06  Yes Historical Provider, MD  divalproex (DEPAKOTE SPRINKLE) 125 MG capsule Take 250 mg by mouth 2 (two) times daily.   Yes Historical Provider, MD  docusate sodium (COLACE) 100 MG capsule Take 1 capsule (100 mg total) by mouth 2 (two) times daily. Hold if diarrhea develops 02/10/13  Yes Stephani PoliceMarianne L York, PA-C  FeFum-FePoly-FA-B Cmp-C-Biot (INTEGRA PLUS) CAPS Take 1 capsule by mouth every Monday, Wednesday, and Friday.    Yes Historical Provider, MD  hydrocortisone-pramoxine [facility](PROCTOFOAM-HC) rectal foam Place 1 applicator rectally 3 (three) times daily.   Yes Historical Provider, MD  lactose free nutrition (BOOST PLUS) LIQD Take 237 mLs by mouth 2 (two) times daily.   Yes Historical Provider, MD  LORazepam (ATIVAN) 0.5 MG tablet Take 0.5 mg by mouth 2 (two) times daily as needed for anxiety.   Yes Historical Provider, MD  losartan (COZAAR) 100 MG tablet Take 100 mg by mouth daily.   Yes Historical Provider, MD  polyethylene glycol (MIRALAX / GLYCOLAX) packet Take 17 g by mouth daily. 02/10/13  Yes Marianne L York, PA-C  QUEtiapine (SEROQUEL) 25 MG tablet Take 25 mg by mouth daily.   Yes Historical Provider, MD    Physical Exam: Filed Vitals:   05/04/2013 [id number] 05/11/2013 2116 04/30/2013 2127 05/06/2013 2141  BP:   101/49   Pulse:      Temp: 99.9 F (37.7 C)   98.3 F (36.8 C)  TempSrc: Rectal   Rectal  Resp:   25   Weight:  45.36 kg (100 lb)    SpO2:   100%      General:  Well-developed poorly nourished.  Eyes: Anicteric no pallor.  ENT: No discharge from ears eyes nose mouth.  Neck: No mass felt.  Cardiovascular: S1-S2 heard.  Respiratory: Decreased breath sounds on the left side.  Abdomen: Soft  nontender.  Skin: No rash.  Musculoskeletal: No edema.  Psychiatric: No acute issues.  Neurologic: Patient is hard of hearing.  Labs on Admission:  Basic Metabolic Panel:  Recent Labs Lab 05/28/13 2025  NA 147  K 4.6  CL 110  CO2 31  GLUCOSE 112*  BUN 35*  CREATININE 0.99  CALCIUM 8.5   Liver Function Tests:  Recent Labs Lab 05-28-13 2025  AST 22  ALT 14  ALKPHOS 66  BILITOT 0.3  PROT 7.6  ALBUMIN 2.1*   No results found for this basename: LIPASE, AMYLASE,  in the last 168 hours No results found for this basename: AMMONIA,  in the last 168 hours CBC:  Recent Labs Lab 28-May-2013 2025  WBC 6.9  NEUTROABS 5.8  HGB 10.5*  HCT 33.5*  MCV 93.1  PLT 337   Cardiac Enzymes:  Recent Labs Lab 2013/05/28 2025  TROPONINI <0.30    BNP (last 3 results) No results found for this basename: PROBNP,  in the last 8760 hours CBG: No results found for this basename: GLUCAP,  in the last 168 hours  Radiological Exams on Admission: Dg Chest Port 1 View  05-28-13   CLINICAL DATA:  Shortness of breath, history of hypertension  EXAM: PORTABLE CHEST - 1 VIEW  COMPARISON:  02/07/2013  FINDINGS: There is new near total opacification of the left hemi thorax with minimal residual aeration of left upper lobe. Mediastinal shift to the left is identified. Chronically prominent right lung markings are reidentified without focal right-sided pulmonary parenchymal opacity. Probable prominence of a vessel seen on-end incidentally noted over the right hilar region. Heart size cannot be evaluated. No acute osseous abnormality is identified.  IMPRESSION: New near total opacification of the left hemi thorax reflecting large pleural effusion and associated compressive atelectasis. The underlying lung is not visualized with the exception of minimally residually aerated left upper lobe.   Electronically Signed   By: Christiana Pellant M.D.   On: 05-28-13 20:31    Assessment/Plan Principal  Problem:   Pleural effusion Active Problems:   Hypertension   Anemia of other chronic disease   Hypoxia   1. Large left pleural effusion - given the history of cough and fever with productive sputum patient has been empirically started on antibiotics for health care associated pneumonia. Check influenza PCR. I have discussed with on-call pulmonary critical care for thoracentesis. Patient presently is not in acute distress. 2. Dementia with worsening mental status - since patient is on Depakote we will check Depakote levels and ammonia levels. 3. Hypertension - continue home medications for now. 4. Anemia - follow CBC. 5. Patient is hard of hearing. 6. Possible UTI - patient on empiric antibiotics. Follow urine cultures.  I have reviewed patient's old charts and labs. I have discussed with on-call pulmonary critical care.   Code Status: DO NOT RESUSCITATE.  Family Communication: Patient's son at the bedside.  Disposition Plan: Admit to inpatient.  Seham Gardenhire N. [facility] Pager [phone].  If 7PM-7AM, please contact night-coverage [email or web address] Password [facility] 2013-06-13, 10:57 PM

## 2013-05-21 NOTE — ED Notes (Signed)
Bed: [id number] Expected date:  Expected time:  Means of arrival:  Comments: ems pneumonia

## 2013-05-21 NOTE — ED Notes (Signed)
[facility] by facility MD for possible pneumonia. Son reassured treatment could be preformed there at facility but son requested pt to be transferred here for further evaluation.

## 2013-05-21 NOTE — ED Notes (Signed)
Pt brief changed due to saturation with urine. Pt has two stage 2 pressure ulcers and one tunneling pressure ulcer to bottom. PA Jennifer at bedside to assess ulcers on bottom.

## 2013-05-21 NOTE — Progress Notes (Signed)
ANTIBIOTIC CONSULT NOTE - INITIAL  Pharmacy Consult for Vancomycin/Cefepime Indication: pneumonia  No Known Allergies  Patient Measurements: Weight: 100 lb (45.36 kg)  Vital Signs: Temp: 99.9 F (37.7 C) (01/23 1905) Temp src: Rectal (01/23 1905) BP: 101/49 mmHg (01/23 2127) Pulse Rate: 105 (01/23 1849) Intake/Output from previous day:   Intake/Output from this shift:    Labs:  Recent Labs  05/19/2013 2025  WBC 6.9  HGB 10.5*  PLT 337  CREATININE 0.99   The CrCl is unknown because both a height and weight (above a minimum accepted value) are required for this calculation. No results found for this basename: VANCOTROUGH, VANCOPEAK, VANCORANDOM, GENTTROUGH, GENTPEAK, GENTRANDOM, TOBRATROUGH, TOBRAPEAK, TOBRARND, AMIKACINPEAK, AMIKACINTROU, AMIKACIN,  in the last 72 hours   Microbiology: No results found for this or any previous visit (from the past 720 hour(s)).  Medical History: Past Medical History  Diagnosis Date  . Dementia   . Hypertension   . Diabetes mellitus   . Anemia, unspecified   . Hearing loss   . Generalized muscle weakness     Medications:   (Not in a hospital admission) Assessment: [age] yo female with PMH significant for dementia, HTN, DM, anemia and hearing loss [facility] with chief complaint of 2 weeks of non-productive cough, increased fatigue and generalized weakness. Cefepime and Vanco to be started tonight for PNA. Scr=0.99 with est CLCr (CG 19, Normalized 29)  Goal of Therapy:  Vancomycin trough level 15-20 mcg/ml  Plan:  . Will give Cefepime 1 gm IV Q24h . Vancomycin 500mg  IV Q48 and will check Vanco trough level at steady state . Will Blood Cx, Urine Cx and Scr  Dorethea ClanFrens, Kelvin Burpee Ann 05/11/2013,9:29 PM

## 2013-05-22 ENCOUNTER — Encounter: Payer: Self-pay | Admitting: Internal Medicine

## 2013-05-22 ENCOUNTER — Inpatient Hospital Stay (HOSPITAL_COMMUNITY): Payer: Medicare Other

## 2013-05-22 ENCOUNTER — Encounter (HOSPITAL_COMMUNITY): Payer: Self-pay | Admitting: Internal Medicine

## 2013-05-22 ENCOUNTER — Non-Acute Institutional Stay (SKILLED_NURSING_FACILITY): Payer: Medicare Other | Admitting: Internal Medicine

## 2013-05-22 DIAGNOSIS — J690 Pneumonitis due to inhalation of food and vomit: Secondary | ICD-10-CM

## 2013-05-22 DIAGNOSIS — J9601 Acute respiratory failure with hypoxia: Secondary | ICD-10-CM

## 2013-05-22 DIAGNOSIS — J90 Pleural effusion, not elsewhere classified: Secondary | ICD-10-CM

## 2013-05-22 DIAGNOSIS — D649 Anemia, unspecified: Secondary | ICD-10-CM

## 2013-05-22 DIAGNOSIS — D638 Anemia in other chronic diseases classified elsewhere: Secondary | ICD-10-CM

## 2013-05-22 LAB — LACTATE DEHYDROGENASE, PLEURAL OR PERITONEAL FLUID: LD FL: 210 U/L — AB (ref 3–23)

## 2013-05-22 LAB — BASIC METABOLIC PANEL
BUN: 36 mg/dL — AB (ref 6–23)
BUN: 35 mg/dL — ABNORMAL HIGH (ref 6–23)
CHLORIDE: 113 meq/L — AB (ref 96–112)
CO2: 32 mEq/L (ref 19–32)
CO2: 27 mEq/L (ref 19–32)
Calcium: 8.4 mg/dL (ref 8.4–10.5)
Calcium: 8.3 mg/dL — ABNORMAL LOW (ref 8.4–10.5)
Chloride: 109 mEq/L (ref 96–112)
Creatinine, Ser: 1.02 mg/dL (ref 0.50–1.10)
Creatinine, Ser: 0.91 mg/dL (ref 0.50–1.10)
GFR calc Af Amer: 49 mL/min — ABNORMAL LOW (ref >90)
GFR calc Af Amer: 56 mL/min — ABNORMAL LOW (ref >90)
GFR calc non Af Amer: 42 mL/min — ABNORMAL LOW (ref >90)
GFR, EST NON AFRICAN AMERICAN: 48 mL/min — AB (ref >90)
GLUCOSE: 93 mg/dL (ref 70–99)
Glucose, Bld: 232 mg/dL — ABNORMAL HIGH (ref 70–99)
POTASSIUM: 4.6 meq/L (ref 3.7–5.3)
POTASSIUM: 4.4 meq/L (ref 3.7–5.3)
Sodium: 150 mEq/L — ABNORMAL HIGH (ref 137–147)
Sodium: 148 mEq/L — ABNORMAL HIGH (ref 137–147)

## 2013-05-22 LAB — BODY FLUID CELL COUNT WITH DIFFERENTIAL
EOS FL: 0 %
Lymphs, Fluid: 13 %
Monocyte-Macrophage-Serous Fluid: 28 % — ABNORMAL LOW (ref 50–90)
NEUTROPHIL FLUID: 59 % — AB (ref 0–25)
WBC FLUID: 1006 uL — AB (ref 0–1000)

## 2013-05-22 LAB — CBC
HEMATOCRIT: 32.2 % — AB (ref 36.0–46.0)
HEMOGLOBIN: 9.7 g/dL — AB (ref 12.0–15.0)
MCH: 28.3 pg (ref 26.0–34.0)
MCHC: 30.1 g/dL (ref 30.0–36.0)
MCV: 93.9 fL (ref 78.0–100.0)
Platelets: 322 10*3/uL (ref 150–400)
RBC: 3.43 MIL/uL — ABNORMAL LOW (ref 3.87–5.11)
RDW: 15.6 % — ABNORMAL HIGH (ref 11.5–15.5)
WBC: 6.3 10*3/uL (ref 4.0–10.5)

## 2013-05-22 LAB — VALPROIC ACID LEVEL: Valproic Acid Lvl: 14.6 ug/mL — ABNORMAL LOW (ref 50.0–100.0)

## 2013-05-22 LAB — INFLUENZA PANEL BY PCR (TYPE A & B)
H1N1FLUPCR: NOT DETECTED — AB
Influenza A By PCR: NEGATIVE
Influenza B By PCR: NEGATIVE

## 2013-05-22 LAB — AMMONIA: Ammonia: 22 umol/L (ref 11–60)

## 2013-05-22 LAB — GLUCOSE, SEROUS FLUID: GLUCOSE FL: 79 mg/dL

## 2013-05-22 LAB — TSH: TSH: 1.051 u[IU]/mL (ref 0.350–4.500)

## 2013-05-22 LAB — PRO B NATRIURETIC PEPTIDE: Pro B Natriuretic peptide (BNP): 719.8 pg/mL — ABNORMAL HIGH (ref 0–450)

## 2013-05-22 LAB — PH, BODY FLUID: PH, FLUID: 8

## 2013-05-22 MED ORDER — SODIUM CHLORIDE 0.9 % IJ SOLN
3.0000 mL | Freq: Two times a day (BID) | INTRAMUSCULAR | Status: DC
Start: 2013-05-22 — End: 2013-05-25

## 2013-05-22 MED ORDER — HYDROCORTISONE ACE-PRAMOXINE 1-1 % RE FOAM
1.0000 | Freq: Three times a day (TID) | RECTAL | Status: DC
  Administered 2013-05-22 – 2013-05-24 (×7): 1 via RECTAL
  Filled 2013-05-22 (×2): qty 10

## 2013-05-22 MED ORDER — LOSARTAN POTASSIUM 50 MG PO TABS
100.0000 mg | ORAL_TABLET | Freq: Every day | ORAL | Status: DC
  Administered 2013-05-22 – 2013-05-23 (×2): 100 mg via ORAL
  Filled 2013-05-22 (×2): qty 2

## 2013-05-22 MED ORDER — BOOST PLUS PO LIQD
237.0000 mL | Freq: Two times a day (BID) | ORAL | Status: DC
  Administered 2013-05-22 – 2013-05-23 (×3): 237 mL via ORAL
  Filled 2013-05-22 (×7): qty 237

## 2013-05-22 MED ORDER — SODIUM CHLORIDE 0.9 % IJ SOLN
3.0000 mL | Freq: Two times a day (BID) | INTRAMUSCULAR | Status: DC
  Administered 2013-05-23: 12:00:00 via INTRAVENOUS

## 2013-05-22 MED ORDER — POLYETHYLENE GLYCOL 3350 17 G PO PACK
17.0000 g | PACK | Freq: Every day | ORAL | Status: DC
  Administered 2013-05-22 – 2013-05-23 (×2): 17 g via ORAL
  Filled 2013-05-22 (×3): qty 1

## 2013-05-22 MED ORDER — LORAZEPAM 0.5 MG PO TABS
0.5000 mg | ORAL_TABLET | Freq: Two times a day (BID) | ORAL | Status: DC | PRN
  Administered 2013-05-22 (×2): 0.5 mg via ORAL
  Filled 2013-05-22 (×2): qty 1

## 2013-05-22 MED ORDER — DIVALPROEX SODIUM 125 MG PO CPSP
250.0000 mg | ORAL_CAPSULE | Freq: Two times a day (BID) | ORAL | Status: DC
  Administered 2013-05-22 – 2013-05-24 (×4): 250 mg via ORAL
  Filled 2013-05-22 (×7): qty 2

## 2013-05-22 MED ORDER — ONDANSETRON HCL 4 MG/2ML IJ SOLN: 4.0000 mg | Freq: Four times a day (QID) | INTRAMUSCULAR | Status: DC | PRN

## 2013-05-22 MED ORDER — POLYSACCHARIDE IRON COMPLEX 150 MG PO CAPS
150.0000 mg | ORAL_CAPSULE | ORAL | Status: DC
  Administered 2013-05-24: 150 mg via ORAL
  Filled 2013-05-22: qty 1

## 2013-05-22 MED ORDER — QUETIAPINE FUMARATE 25 MG PO TABS
25.0000 mg | ORAL_TABLET | Freq: Every day | ORAL | Status: DC
  Administered 2013-05-22 – 2013-05-24 (×3): 25 mg via ORAL
  Filled 2013-05-22 (×3): qty 1

## 2013-05-22 MED ORDER — ONDANSETRON HCL 4 MG PO TABS: 4.0000 mg | ORAL_TABLET | Freq: Four times a day (QID) | ORAL | Status: DC | PRN

## 2013-05-22 MED ORDER — DEXTROSE 5 % IV SOLN
INTRAVENOUS | Status: AC
  Administered 2013-05-22: 07:00:00 via INTRAVENOUS

## 2013-05-22 MED ORDER — DEXTROSE 5 % IV SOLN
500.0000 mg | Freq: Once | INTRAVENOUS | Status: AC
  Administered 2013-05-22: 500 mg via INTRAVENOUS
  Filled 2013-05-22: qty 500

## 2013-05-22 MED ORDER — ASPIRIN EC 81 MG PO TBEC
81.0000 mg | DELAYED_RELEASE_TABLET | Freq: Every day | ORAL | Status: DC
  Administered 2013-05-22 – 2013-05-24 (×3): 81 mg via ORAL
  Filled 2013-05-22 (×3): qty 1

## 2013-05-22 MED ORDER — DEXTROSE 5 % IV SOLN
250.0000 mg | INTRAVENOUS | Status: DC
  Administered 2013-05-23 – 2013-05-24 (×2): 250 mg via INTRAVENOUS
  Filled 2013-05-22 (×2): qty 250

## 2013-05-22 MED ORDER — ACETAMINOPHEN 325 MG PO TABS: 650.0000 mg | ORAL_TABLET | Freq: Four times a day (QID) | ORAL | Status: DC | PRN

## 2013-05-22 MED ORDER — DOCUSATE SODIUM 100 MG PO CAPS
100.0000 mg | ORAL_CAPSULE | Freq: Two times a day (BID) | ORAL | Status: DC
  Administered 2013-05-22 – 2013-05-23 (×3): 100 mg via ORAL
  Filled 2013-05-22 (×6): qty 1

## 2013-05-22 MED ORDER — ACETAMINOPHEN 650 MG RE SUPP: 650.0000 mg | Freq: Four times a day (QID) | RECTAL | Status: DC | PRN

## 2013-05-22 NOTE — Progress Notes (Signed)
This encounter was created in error - please disregard. This encounter was created in error - please disregard. This encounter was created in error - please disregard. 

## 2013-05-22 NOTE — Progress Notes (Addendum)
Patient ID: Kara [name], female   DOB: 1905-07-07, [age] y.o.   MRN: [id number]  [facility] PROGRESS NOTE  Kara Rockslizabeth Platner [id number] DOB: 1905-07-07 DOA: 2013-09-04 PCP: Laurena SlimmerLARK,PRESTON S, MD  Brief narrative: [age] y.o. female history of hypertension, difficulty hearing, dementia and anemia was transferred from the nursing home after found to be hypoxic with shortness of breath. As per patient's son who provided most of the history on admission, patient has been having increasing cough with shortness of breath over the last 2 weeks. In the ER patient's chest x-ray shows complete opacification of the left chest from possible left pleural effusion. Patient has been empirically started on antibiotics for health care associated pneumonia, given the history of shortness of breath, productive cough and fever.  Principal Problem:   Pleural effusion - unclear etiology at this time - appreciate PCCM assistance  - Blood Cultures x 2, Urine Culture, Sputum Culture, Pleural Fluid Culture still pending  - continue supportive care, oxygen as needed  - empiric ABX Vancomycin, Maxipime, Zithromax  Active Problems:   Hypernatremia - most likely secondary to pre renal etiology - encourage PO intake once pt more alert - continue D5 fluids via IV  - advance diet as pt able to tolerate    Hypertension - reasonable inpatient control  - continue losartan    Anemia of other chronic disease - no signs of active bleed - CBC in AM   Consultants:  PCCM   Procedures/Studies: Dg Chest Port 1 View  2013-09-04  New near total opacification of the left hemi thorax reflecting large pleural effusion and associated compressive atelectasis. The underlying lung is not visualized with the exception of minimally residually aerated left upper lobe.   Dg Chest Port 1v Same Day 05/22/2013  No definite evidence of pneumothorax. Mild interval decrease in size of large left-sided pleural effusion, though a large  effusion is still present. Underlying airspace opacification is better characterized status post thoracentesis. Vague hazy airspace opacity within the right lung raises concern for pneumonia.  Thoracentesis 01/24 by PCCM team  Antibiotics:  Zithromax 01/23 -->  Maxipime 01/23 -->   Vancomycin 01/23 -->  Code Status: DNR Family Communication: No family at bedside Disposition Plan: Remains inpatient   HPI/Subjective: No events overnight.   Objective: Filed Vitals:   08/31/13 2141 05/22/13 0000 05/22/13 0030 05/22/13 0600  BP:  107/51 103/49 117/52  Pulse:  76 80 79  Temp: 98.3 F (36.8 C)  97.5 F (36.4 C) 97.5 F (36.4 C)  TempSrc: Rectal  Axillary Axillary  Resp:  25 22 20   Height:      Weight:      SpO2:  100% 98% 97%    Intake/Output Summary (Last 24 hours) at 05/22/13 1245 Last data filed at 05/22/13 0833  Gross per 24 hour  Intake    350 ml  Output      0 ml  Net    350 ml    Exam:   General:  Pt is somnolent and difficult to arouse, opens eyes  Cardiovascular: Regular rate and rhythm, S1/S2, no murmurs, no rubs, no gallops  Respiratory: Diminished breath sounds bilaterally   Abdomen: Soft, non tender, non distended, bowel sounds present, no guarding  Extremities: No edema, pulses DP and PT palpable bilaterally  Data Reviewed: Basic Metabolic Panel:  Recent Labs Lab 08/31/13 2025 05/22/13 0130 05/22/13 1159  NA 147 150* 148*  K 4.6 4.6 4.4  CL 110 113* 109  CO2 31 32 27  GLUCOSE 112* 93 232*  BUN 35* 36* 35*  CREATININE 0.99 1.02 0.91  CALCIUM 8.5 8.4 8.3*   Liver Function Tests:  Recent Labs Lab 05-23-13 2025  AST 22  ALT 14  ALKPHOS 66  BILITOT 0.3  PROT 7.6  ALBUMIN 2.1*    Recent Labs Lab 05/22/13 0130  AMMONIA 22   CBC:  Recent Labs Lab 05/23/2013 2025 05/22/13 0130  WBC 6.9 6.3  NEUTROABS 5.8  --   HGB 10.5* 9.7*  HCT 33.5* 32.2*  MCV 93.1 93.9  PLT 337 322   Cardiac Enzymes:  Recent Labs Lab  May 23, 2013 2025  TROPONINI <0.30   Scheduled Meds: . aspirin EC  81 mg Oral Daily  . [START ON 05/23/2013] azithromycin  250 mg Intravenous Q24H  . ceFEPime (MAXIPIME) IV  1 g Intravenous Q24H  . divalproex  250 mg Oral BID  . docusate sodium  100 mg Oral BID  . hydrocortisone-pramoxine  1 applicator Rectal TID  . [START ON 05/10/2013] iron polysaccharides  150 mg Oral Q M,W,F  . lactose free nutrition  237 mL Oral BID  . losartan  100 mg Oral Daily  . polyethylene glycol  17 g Oral Daily  . QUEtiapine  25 mg Oral Daily  . sodium chloride  3 mL Intravenous Q12H  . sodium chloride  3 mL Intravenous Q12H  . vancomycin  500 mg Intravenous Q48H   Continuous Infusions: . dextrose 50 mL/hr at 05/22/13 0650     Debbora Presto, MD  [facility] Pager [phone]  If 7PM-7AM, please contact night-coverage [email or web address] Password [email or web address] 05/22/2013, 12:45 PM   LOS: 1 day

## 2013-05-22 NOTE — ED Provider Notes (Signed)
Medical screening examination/treatment/procedure(s) were conducted as a shared visit with non-physician practitioner(s) and myself.  I personally evaluated the patient during the encounter.  EKG Interpretation    Date/Time:  Friday May 21 2013 19:22:53 EST Ventricular Rate:  97 PR Interval:  204 QRS Duration: 75 QT Interval:  342 QTC Calculation: 434 R Axis:   -47 Text Interpretation:  Sinus rhythm Ventricular premature complex Probable left atrial enlargement Left anterior fascicular block Low voltage, precordial leads Abnormal R-wave progression, early transition Nonspecific T abnormalities, lateral leads Baseline wander in lead(s) V3 No significant change since last tracing Confirmed by Alfredia Desanctis  MD, Joycelyn Liska ([id number]) on 2014-03-02 7:37:36 PM            I interviewed and examined the patient. Lungs w/ dec BS in the right lung diffusely. Cardiac exam wnl. Mildly tachycardic. Abdomen soft.  Pt hypoxic on RA, found to have opacification of right lung. Will tx for HCAP and admit.   Junius ArgyleForrest S Luddie Boghosian, MD 05/22/13 [phone]

## 2013-05-22 NOTE — Consult Note (Signed)
PULMONARY  / CRITICAL CARE MEDICINE CONSULTATION  Name: Kara [name] MRN: [id number] DOB: 06/23/1905    ADMISSION DATE:  05/08/2013  CHIEF COMPLAINT:  Hypoxemia  BRIEF PATIENT DESCRIPTION: 80 yo F with DM, Dementia who was transferred from NH at son's request for evaluation of lethargy and was found to be hypoxemic in setting of large left pleural effusion. PCCM consulted for pleural effusion evaluation.  SIGNIFICANT EVENTS / STUDIES:  1. Thoracentesis 05/22/2013  LINES / TUBES: 1. PIV  CULTURES: 1. Blood Cultures x 2 1/23- 2. Urine Culture 1/23- 3. Sputum Culture 1/23- 4. Pleural Fluid Culture 1/23-   ANTIBIOTICS: 1. Vanc 1/23- 2. Cefepime 1/23- 3. Azithro 1/23-  HISTORY OF PRESENT ILLNESS:  Kara [name] is a 80 yo F with DM, and dementia who was transferred from her nursing home today in the setting of hypoxemia. The following is obtained from a chart review and from the patient's son, [name] [phone]), as the patient is unable to provide much history. She has had decreased activity and a cough x 2 weeks.   PAST MEDICAL HISTORY :  Past Medical History  Diagnosis Date  . Dementia   . Hypertension   . Diabetes mellitus   . Anemia, unspecified   . Hearing loss   . Generalized muscle weakness     Past Surgical History  Procedure Laterality Date  . Hip fracture surgery      Prior to Admission medications   Medication Sig Start Date End Date Taking? Authorizing Provider  acetaminophen (TYLENOL) 325 MG tablet Take 650 mg by mouth every 6 (six) hours as needed for moderate pain or fever. 02/10/13  Yes Tora Kindred York, PA-C  aspirin 81 MG EC tablet Take 81 mg by mouth daily.   08/13/06  Yes Historical Provider, MD  divalproex (DEPAKOTE SPRINKLE) 125 MG capsule Take 250 mg by mouth 2 (two) times daily.   Yes Historical Provider, MD  docusate sodium (COLACE) 100 MG capsule Take 1 capsule (100 mg total) by mouth 2 (two) times daily. Hold if diarrhea develops 02/10/13  Yes  Stephani Police, PA-C  FeFum-FePoly-FA-B Cmp-C-Biot (INTEGRA PLUS) CAPS Take 1 capsule by mouth every Monday, Wednesday, and Friday.    Yes Historical Provider, MD  hydrocortisone-pramoxine [facility]) rectal foam Place 1 applicator rectally 3 (three) times daily.   Yes Historical Provider, MD  lactose free nutrition (BOOST PLUS) LIQD Take 237 mLs by mouth 2 (two) times daily.   Yes Historical Provider, MD  LORazepam (ATIVAN) 0.5 MG tablet Take 0.5 mg by mouth 2 (two) times daily as needed for anxiety.   Yes Historical Provider, MD  losartan (COZAAR) 100 MG tablet Take 100 mg by mouth daily.   Yes Historical Provider, MD  polyethylene glycol (MIRALAX / GLYCOLAX) packet Take 17 g by mouth daily. 02/10/13  Yes Marianne L York, PA-C  QUEtiapine (SEROQUEL) 25 MG tablet Take 25 mg by mouth daily.   Yes Historical Provider, MD    No Known Allergies  FAMILY HISTORY:  History reviewed. No pertinent family history.  SOCIAL HISTORY:  reports that she has never smoked. She does not have any smokeless tobacco history on file. She reports that she does not drink alcohol or use illicit drugs.  REVIEW OF SYSTEMS:  Unable to obtain secondary to patient condition.  PHYSICAL EXAM  VITAL SIGNS: Temp:  [97.5 F (36.4 C)-99.9 F (37.7 C)] 97.5 F (36.4 C) (01/24 0030) Pulse Rate:  [76-105] 80 (01/24 0030) Resp:  [20-25] 22 (01/24 0030) BP: (  101-158)/(49-91) 103/49 mmHg (01/24 0030) SpO2:  [96 %-100 %] 98 % (01/24 0030) Weight:  [100 lb (45.36 kg)] 100 lb (45.36 kg) (01/23 2116)  HEMODYNAMICS:    VENTILATOR SETTINGS:    INTAKE / OUTPUT: Intake/Output   None     PHYSICAL EXAMINATION: General:  Elderly F in NAD Neuro: A+Ox1, responds to some questions, contracted HEENT:  Sclera anicteric, conjunctiva pink, MMM Neck:  Trachea supple and midline, (-) LAN Cardiovascular:  RRR, NS1/S2, (-) MRG Lungs:  Absent Breath Sounds at Left Base Abdomen:  S/NT/ND/(+)BS Musculoskeletal:  (-)  C/C/E Skin:  Decubitus dressing C/D/I  LABS:  CBC Recent Labs     05/12/2013  2025  05/22/13  0130  WBC  6.9  6.3  HGB  10.5*  9.7*  HCT  33.5*  32.2*  PLT  337  322    Coag's No results found for this basename: APTT, INR,  in the last 72 hours  BMET Recent Labs     05/04/2013  2025  05/22/13  0130  NA  147  150*  K  4.6  4.6  CL  110  113*  CO2  31  32  BUN  35*  36*  CREATININE  0.99  1.02  GLUCOSE  112*  93    Electrolytes Recent Labs     05/06/2013  2025  05/22/13  0130  CALCIUM  8.5  8.4    Sepsis Markers No results found for this basename: LACTICACIDVEN, PROCALCITON, O2SATVEN,  in the last 72 hours  ABG Recent Labs     05/22/2013  2020  PHART  7.373  PCO2ART  54.9*  PO2ART  91.8    Liver Enzymes Recent Labs     05/11/2013  2025  AST  22  ALT  14  ALKPHOS  66  BILITOT  0.3  ALBUMIN  2.1*    Cardiac Enzymes Recent Labs     05/29/2013  2025  05/22/13  0130  TROPONINI  <0.30   --   PROBNP   --   719.8*    Glucose No results found for this basename: GLUCAP,  in the last 72 hours  Imaging Dg Chest Port 1 View  05/20/2013   CLINICAL DATA:  Shortness of breath, history of hypertension  EXAM: PORTABLE CHEST - 1 VIEW  COMPARISON:  02/07/2013  FINDINGS: There is new near total opacification of the left hemi thorax with minimal residual aeration of left upper lobe. Mediastinal shift to the left is identified. Chronically prominent right lung markings are reidentified without focal right-sided pulmonary parenchymal opacity. Probable prominence of a vessel seen on-end incidentally noted over the right hilar region. Heart size cannot be evaluated. No acute osseous abnormality is identified.  IMPRESSION: New near total opacification of the left hemi thorax reflecting large pleural effusion and associated compressive atelectasis. The underlying lung is not visualized with the exception of minimally residually aerated left upper lobe.   Electronically Signed    By: Christiana Pellant M.D.   On: 05/07/2013 20:31   CXR: CXR from today was personally reviewed by me. There is near whiteout of the left lung.  ASSESSMENT / PLAN: Principal Problem:   Pleural effusion Active Problems:   Hypertension   Anemia of other chronic disease   Hypoxia    1. Hypoxemic Respiratory Failure in the Setting of Pleural Effusion: The DDx includes empyema/parapneumonic effusion vs. Malignant effusion. There was no frank pus in the thoracentesis, studies pending.   Agree with emperic  HCAP Coverage  Add Azithro  Follow-up pleural effusion studies  I have personally obtained a history, examined the patient, evaluated laboratory and imaging results, formulated the assessment and plan and placed orders.  Evalyn CascoAdam Tayllor Breitenstein, MD Pulmonary and Critical Care Medicine [facility] Pager: [phone]  05/22/2013, 4:52 AM

## 2013-05-22 NOTE — Procedures (Addendum)
Thoracentesis Procedure Note  Pre-operative Diagnosis: Left Pleural Effusion  Post-operative Diagnosis: same  Indications: RO empyema  Procedure Details  Consent: Informed consent was obtained from patient's son, Kara [name]. Risks of the procedure were discussed including: infection, bleeding, pain, pneumothorax.  A suitable pocket of pleural fluid was located with ultrasound. Under sterile conditions the patient was drapped and positioned in the standard fashion.  1% buffered lidocaine was used to anesthetize the ~8th rib space. Fluid was obtained without any difficulties and minimal blood loss.  A dressing was applied to the wound and wound care instructions were provided.   Findings 1400 ml of bloody pleural fluid was obtained. A sample was sent to Pathology for LDH, pH, Glucose, Cell count and differential, cytology, and culture.  Complications:  None; patient tolerated the procedure well.          Condition: stable  Plan A follow up chest x-ray was ordered.  I used ultrasound to locate and access the pleural fluid pocket.

## 2013-05-23 DIAGNOSIS — R0902 Hypoxemia: Secondary | ICD-10-CM

## 2013-05-23 DIAGNOSIS — N390 Urinary tract infection, site not specified: Secondary | ICD-10-CM

## 2013-05-23 DIAGNOSIS — J90 Pleural effusion, not elsewhere classified: Principal | ICD-10-CM

## 2013-05-23 LAB — BASIC METABOLIC PANEL
BUN: 35 mg/dL — ABNORMAL HIGH (ref 6–23)
CO2: 31 meq/L (ref 19–32)
Calcium: 8 mg/dL — ABNORMAL LOW (ref 8.4–10.5)
Chloride: 108 mEq/L (ref 96–112)
Creatinine, Ser: 1.08 mg/dL (ref 0.50–1.10)
GFR calc Af Amer: 45 mL/min — ABNORMAL LOW (ref >90)
GFR calc non Af Amer: 39 mL/min — ABNORMAL LOW (ref >90)
GLUCOSE: 98 mg/dL (ref 70–99)
POTASSIUM: 4.3 meq/L (ref 3.7–5.3)
SODIUM: 144 meq/L (ref 137–147)

## 2013-05-23 LAB — CBC
HEMATOCRIT: 37.1 % (ref 36.0–46.0)
HEMOGLOBIN: 11.3 g/dL — AB (ref 12.0–15.0)
MCH: 28.3 pg (ref 26.0–34.0)
MCHC: 30.5 g/dL (ref 30.0–36.0)
MCV: 92.8 fL (ref 78.0–100.0)
Platelets: 190 10*3/uL (ref 150–400)
RBC: 4 MIL/uL (ref 3.87–5.11)
RDW: 15.5 % (ref 11.5–15.5)
WBC: 14.1 10*3/uL — ABNORMAL HIGH (ref 4.0–10.5)

## 2013-05-23 MED ORDER — SODIUM CHLORIDE 0.9 % IV BOLUS (SEPSIS)
500.0000 mL | Freq: Once | INTRAVENOUS | Status: AC
  Administered 2013-05-23: 500 mL via INTRAVENOUS

## 2013-05-23 MED ORDER — VANCOMYCIN HCL IN DEXTROSE 750-5 MG/150ML-% IV SOLN
750.0000 mg | INTRAVENOUS | Status: DC
  Administered 2013-05-23: 750 mg via INTRAVENOUS
  Filled 2013-05-23: qty 150

## 2013-05-23 NOTE — Progress Notes (Signed)
INITIAL NUTRITION ASSESSMENT  DOCUMENTATION CODES Per approved criteria  -Not Applicable   INTERVENTION: Recommend increase diet to Dysphagia 3 as tolerated. Continue Boost bid vs strawberry Ensure RD to continue to monitor.  NUTRITION DIAGNOSIS: Inadequate oral intake related to illness as evidenced by poor meal completion.   Goal: Intake to meet >90% estimated nutrition needs.  Monitor:  Weight trends, lab trends, diet advancement and intake, supplement tolerace  Reason for Assessment: MST  [age] y.o. female  Admitting Dx: Pleural effusion  ASSESSMENT: [age] yo female with hx to include dementia, anemia, hard of hearing [facility]  Empirically started on antibiotics for pneumonia.  Intake of full liquid diet is 25-65% of meals.  Receiving Boost.    Height: Ht Readings from Last 1 Encounters:  07-07-13 4' 11.84" (1.52 m)    Weight: Wt Readings from Last 1 Encounters:  07-07-13 100 lb (45.36 kg)    Ideal Body Weight: 100  % Ideal Body Weight: 100  Wt Readings from Last 10 Encounters:  07-07-13 100 lb (45.36 kg)  02/07/13 95 lb (43.092 kg)    Usual Body Weight: n/a  % Usual Body Weight: n/a  BMI:  Body mass index is 19.63 kg/(m^2).  Estimated Nutritional Needs: Kcal: 1200-1500 Protein: 43-50 gm Fluid: 1.2-1.5L daily   Diet Order: Full Liquid  EDUCATION NEEDS: -No education needs identified at this time   Intake/Output Summary (Last 24 hours) at 05/23/13 1421 Last data filed at 05/23/13 1202  Gross per 24 hour  Intake    383 ml  Output      0 ml  Net    383 ml       Labs:   Recent Labs Lab 05/22/13 0130 05/22/13 1159 05/23/13 0601  NA 150* 148* 144  K 4.6 4.4 4.3  CL 113* 109 108  CO2 32 27 31  BUN 36* 35* 35*  CREATININE 1.02 0.91 1.08  CALCIUM 8.4 8.3* 8.0*  GLUCOSE 93 232* 98    CBG (last 3)  No results found for this basename: GLUCAP,  in the last 72 hours  Scheduled Meds: . aspirin EC   81 mg Oral Daily  . azithromycin  250 mg Intravenous Q24H  . ceFEPime (MAXIPIME) IV  1 g Intravenous Q24H  . divalproex  250 mg Oral BID  . docusate sodium  100 mg Oral BID  . hydrocortisone-pramoxine  1 applicator Rectal TID  . [START ON 05/01/2013] iron polysaccharides  150 mg Oral Q M,W,F  . lactose free nutrition  237 mL Oral BID  . polyethylene glycol  17 g Oral Daily  . QUEtiapine  25 mg Oral Daily  . sodium chloride  3 mL Intravenous Q12H  . sodium chloride  3 mL Intravenous Q12H  . vancomycin  750 mg Intravenous Q48H    Continuous Infusions:   Past Medical History  Diagnosis Date  . Dementia   . Hypertension   . Diabetes mellitus   . Anemia, unspecified   . Hearing loss   . Generalized muscle weakness     Past Surgical History  Procedure Laterality Date  . Hip fracture surgery      Oran ReinLaura Mariem Skolnick, RD, LDN Clinical Inpatient Dietitian Pager:  [phone] Weekend and after hours pager:  [phone]

## 2013-05-23 NOTE — Progress Notes (Signed)
Patient ID: Kara [name], female   DOB: February 19, 1906, [age] y.o.   MRN: [id number]  [facility] PROGRESS NOTE  Kara Rockslizabeth Mccrone YNW:[id number] DOB: February 19, 1906 DOA: 05/08/2013 PCP: Laurena SlimmerLARK,PRESTON S, MD  Brief narrative:  [age] y.o. female history of hypertension, difficulty hearing, dementia and anemia was transferred from the nursing home after found to be hypoxic with shortness of breath. As per patient's son who provided most of the history on admission, patient has been having increasing cough with shortness of breath over the last 2 weeks. In the ER patient's chest x-ray shows complete opacification of the left chest from possible left pleural effusion. Patient has been empirically started on antibiotics for health care associated pneumonia, given the history of shortness of breath, productive cough and fever.   Principal Problem:  Pleural effusion  - unclear etiology at this time  - appreciate PCCM assistance  - Blood Cultures x 2, Urine Culture, Sputum Culture, Pleural Fluid Culture still pending  - continue supportive care, oxygen as needed  - empiric ABX Vancomycin, Maxipime, Zithromax  Active Problems:  Hypernatremia  - most likely secondary to pre renal etiology  - encourage PO intake once pt more alert  - WNL this AM Hypertension  - on soft side this AM - discontinue losartan  Anemia of other chronic disease  - no signs of active bleed  - CBC in AM   Consultants:  PCCM  Procedures/Studies:  Dg Chest Port 1 View 05/20/2013 New near total opacification of the left hemi thorax reflecting large pleural effusion and associated compressive atelectasis. The underlying lung is not visualized with the exception of minimally residually aerated left upper lobe.  Dg Chest Port 1v Same Day 05/22/2013 No definite evidence of pneumothorax. Mild interval decrease in size of large left-sided pleural effusion, though a large effusion is still present. Underlying airspace opacification is better  characterized status post thoracentesis. Vague hazy airspace opacity within the right lung raises concern for pneumonia.  Thoracentesis 01/24 by PCCM team Antibiotics:  Zithromax 01/23 -->  Maxipime 01/23 -->  Vancomycin 01/23 -->  Code Status: DNR  Family Communication: No family at bedside  Disposition Plan: Remains inpatient   HPI/Subjective: No events overnight.   Objective: Filed Vitals:   05/22/13 0600 05/22/13 1500 05/22/13 2128 05/23/13 0615  BP: 117/52 108/61 92/60 86/54   Pulse: 79 66 80 64  Temp: 97.5 F (36.4 C) 98.9 F (37.2 C) 99.1 F (37.3 C) 97.7 F (36.5 C)  TempSrc: Axillary Axillary Axillary Axillary  Resp: 20 20 18 18   Height:      Weight:      SpO2: 97% 96% 97% 96%    Intake/Output Summary (Last 24 hours) at 05/23/13 1231 Last data filed at 05/23/13 1202  Gross per 24 hour  Intake    383 ml  Output      0 ml  Net    383 ml    Exam:   General:  Pt is alert but confused, not following any commands, frail, cachectic   Cardiovascular: Regular rate and rhythm, S1/S2, no murmurs, no rubs, no gallops  Respiratory: Poor inspiratory effort, no wheezing   Abdomen: Soft, non tender, non distended, bowel sounds present, no guarding  Extremities: No edema, pulses DP and PT palpable bilaterally  Data Reviewed: Basic Metabolic Panel:  Recent Labs Lab 05/02/2013 2025 05/22/13 0130 05/22/13 1159 05/23/13 0601  NA 147 150* 148* 144  K 4.6 4.6 4.4 4.3  CL 110 113* 109 108  CO2 31 32 27  31  GLUCOSE 112* 93 232* 98  BUN 35* 36* 35* 35*  CREATININE 0.99 1.02 0.91 1.08  CALCIUM 8.5 8.4 8.3* 8.0*   Liver Function Tests:  Recent Labs Lab 04-Jun-2013 2025  AST 22  ALT 14  ALKPHOS 66  BILITOT 0.3  PROT 7.6  ALBUMIN 2.1*    Recent Labs Lab 05/22/13 0130  AMMONIA 22   CBC:  Recent Labs Lab 06/04/2013 2025 05/22/13 0130 05/23/13 0601  WBC 6.9 6.3 14.1*  NEUTROABS 5.8  --   --   HGB 10.5* 9.7* 11.3*  HCT 33.5* 32.2* 37.1  MCV 93.1 93.9  92.8  PLT 337 322 190   Cardiac Enzymes:  Recent Labs Lab 2013-06-04 2025  TROPONINI <0.30   Recent Results (from the past 240 hour(s))  URINE CULTURE     Status: None   Collection Time    06/04/2013  8:53 PM      Result Value Range Status   Specimen Description URINE, RANDOM   Final   Special Requests NONE   Final   Culture  Setup Time     Final   Value: 05/22/2013 00:53     Performed at [facility] Count PENDING   Incomplete   Culture     Final   Value: Culture reincubated for better growth     Performed at [facility]   Report Status PENDING   Incomplete  BODY FLUID CULTURE     Status: None   Collection Time    05/22/13  4:49 AM      Result Value Range Status   Specimen Description PLEURAL   Final   Special Requests NONE   Final   Gram Stain     Final   Value: RARE WBC PRESENT,BOTH PMN AND MONONUCLEAR     NO ORGANISMS SEEN     Performed at [facility]   Culture PENDING   Incomplete   Report Status PENDING   Incomplete     Scheduled Meds: . aspirin EC  81 mg Oral Daily  . azithromycin  250 mg Intravenous Q24H  . ceFEPime (MAXIPIME) IV  1 g Intravenous Q24H  . divalproex  250 mg Oral BID  . docusate sodium  100 mg Oral BID  . hydrocortisone-pramoxine  1 applicator Rectal TID  . [START ON 05/06/2013] iron polysaccharides  150 mg Oral Q M,W,F  . lactose free nutrition  237 mL Oral BID  . losartan  100 mg Oral Daily  . polyethylene glycol  17 g Oral Daily  . QUEtiapine  25 mg Oral Daily  . sodium chloride  3 mL Intravenous Q12H  . sodium chloride  3 mL Intravenous Q12H  . vancomycin  500 mg Intravenous Q48H   Continuous Infusions:   Debbora Presto, MD  [facility] Pager [phone]  If 7PM-7AM, please contact night-coverage [email or web address] Password [facility] 05/23/2013, 12:31 PM   LOS: 2 days

## 2013-05-23 NOTE — Progress Notes (Signed)
ANTIBIOTIC CONSULT NOTE - FOLLOW UP  Pharmacy Consult for Vancomycin, Cefepime Indication: HCAP, possible UTI  No Known Allergies  Labs:  Recent Labs  2013/05/02 2025 05/22/13 0130 05/22/13 1159 05/23/13 0601  WBC 6.9 6.3  --  14.1*  HGB 10.5* 9.7*  --  11.3*  PLT 337 322  --  190  CREATININE 0.99 1.02 0.91 1.08    Assessment: 89 yoF with PMH significant for dementia, HTN, DM, anemia, hearing loss [facility] two weeks [facility] non-productive cough, increased fatigue and generalized weakness. CXR with new near total opacification of the L hemi thorax reflecting large pleural effusion and associated compressive atelectasis. Abx started for r/o HCAP.   1/23 >> Vanco >> 1/23 >> Cefepime>> 1/24 >> Azithro x 5 doses (MD) >>   Tmax: afeb WBCs: up to 14.1K Renal: Scr 1.08, CG 17, N 32  1/23 blood x 2 >> in process 1/23 urine >> re-incubated  1/24 sputum >> ordered (not collected) 1/24 pleural fluid >> pending 1/24 influenza panel >> negative  Today is D#3 Vanco, Cefepime, D2 azithro for atypical coverage. PCCM performed thoracentesis 1/24 given pleural effusion. Sample sent for culture. Afebrile but WBC trending up. So far pt received 500 mg of Vanc, no bolus with initiation noted.  Goal of Therapy:  Vancomycin trough level 15-20 mcg/ml  Plan:   Change Vancomycin to 750 mg IV x q48h, dose due now   Continue Cefepime 1g IV q24h  Pharmacy will f/u  Geoffry Paradisehuyvan Naszir Cott, PharmD, BCPS Pager: [phone] 1:53 PM Pharmacy #: [id number]

## 2013-05-23 NOTE — Progress Notes (Signed)
PULMONARY  / CRITICAL CARE MEDICINE CONSULTATION  Name: Kara [name] MRN: [id number] DOB: [birth year]    ADMISSION DATE:  05/16/2013  CHIEF COMPLAINT:  Hypoxemia  BRIEF PATIENT DESCRIPTION: 80 yo F with DM, Dementia who was transferred from NH at son's request for evaluation of lethargy and was found to be hypoxemic in setting of large left pleural effusion. PCCM consulted for pleural effusion evaluation.  SIGNIFICANT EVENTS / STUDIES:  1. Thoracentesis 05/22/2013  LINES / TUBES: 1. PIV  CULTURES: 1. Blood Cultures x 2 1/23- 2. Urine Culture 1/23- 3. Sputum Culture 1/23- 4. Pleural Fluid Culture 1/23-   ANTIBIOTICS: 1. Vanc 1/23- 2. Cefepime 1/23- 3. Azithro 1/23-  HISTORY OF PRESENT ILLNESS:  Kara [name] is a 80 yo F with DM, and dementia who was transferred from her nursing home today in the setting of hypoxemia. The following is obtained from a chart review and from the patient's son, Kara [name] [phone]), as the patient is unable to provide much history. She has had decreased activity and a cough x 2 weeks.   Sched MEDS:  . aspirin EC  81 mg Oral Daily  . azithromycin  250 mg Intravenous Q24H  . ceFEPime (MAXIPIME) IV  1 g Intravenous Q24H  . divalproex  250 mg Oral BID  . docusate sodium  100 mg Oral BID  . hydrocortisone-pramoxine  1 applicator Rectal TID  . [START ON 06-12-2013] iron polysaccharides  150 mg Oral Q M,W,F  . lactose free nutrition  237 mL Oral BID  . losartan  100 mg Oral Daily  . polyethylene glycol  17 g Oral Daily  . QUEtiapine  25 mg Oral Daily  . sodium chloride  3 mL Intravenous Q12H  . sodium chloride  3 mL Intravenous Q12H  . vancomycin  500 mg Intravenous Q48H    PHYSICAL EXAM  VITAL SIGNS: Temp:  [97.7 F (36.5 C)-99.1 F (37.3 C)] 97.7 F (36.5 C) (01/25 0615) Pulse Rate:  [64-80] 64 (01/25 0615) Resp:  [18-20] 18 (01/25 0615) BP: (86-108)/(54-61) 86/54 mmHg (01/25 0615) SpO2:  [96 %-97 %] 96 % (01/25 0615)  HEMODYNAMICS:     VENTILATOR SETTINGS:    INTAKE / OUTPUT: Intake/Output     01/24 0701 - 01/25 0700 01/25 0701 - 01/26 0700   P.O. 610 120   Total Intake(mL/kg) 610 (13.4) 120 (2.6)   Net +610 +120        Urine Occurrence 2 x 1 x     PHYSICAL EXAMINATION: General:  Elderly F in NAD Neuro: A+Ox1, responds to some questions, contracted HEENT:  Sclera anicteric, conjunctiva pink, MMM Neck:  Trachea supple and midline, (-) LAN Cardiovascular:  RRR, NS1/S2, (-) MRG Lungs:  Absent Breath Sounds at Left Base Abdomen:  S/NT/ND/(+)BS Musculoskeletal:  (-) C/C/E Skin:  Decubitus dressing C/D/I  LABS:  CBC Recent Labs     05/28/2013  2025  05/22/13  0130  05/23/13  0601  WBC  6.9  6.3  14.1*  HGB  10.5*  9.7*  11.3*  HCT  33.5*  32.2*  37.1  PLT  337  322  190   Coag's No results found for this basename: APTT, INR,  in the last 72 hours  BMET Recent Labs     05/22/13  0130  05/22/13  1159  05/23/13  0601  NA  150*  148*  144  K  4.6  4.4  4.3  CL  113*  109  108  CO2  32  27  31  BUN  36*  35*  35*  CREATININE  1.02  0.91  1.08  GLUCOSE  93  232*  98    Electrolytes Recent Labs     05/22/13  0130  05/22/13  1159  05/23/13  0601  CALCIUM  8.4  8.3*  8.0*    Sepsis Markers No results found for this basename: LACTICACIDVEN, PROCALCITON, O2SATVEN,  in the last 72 hours  ABG Recent Labs     05/01/2013  2020  PHART  7.373  PCO2ART  54.9*  PO2ART  91.8    Liver Enzymes Recent Labs     05/06/2013  2025  AST  22  ALT  14  ALKPHOS  66  BILITOT  0.3  ALBUMIN  2.1*    Cardiac Enzymes Recent Labs     05/11/2013  2025  05/22/13  0130  TROPONINI  <0.30   --   PROBNP   --   719.8*    Glucose No results found for this basename: GLUCAP,  in the last 72 hours  Imaging Dg Chest Port 1 View  05/22/2013   CLINICAL DATA:  Shortness of breath, history of hypertension  EXAM: PORTABLE CHEST - 1 VIEW  COMPARISON:  02/07/2013  FINDINGS: There is new near total opacification  of the left hemi thorax with minimal residual aeration of left upper lobe. Mediastinal shift to the left is identified. Chronically prominent right lung markings are reidentified without focal right-sided pulmonary parenchymal opacity. Probable prominence of a vessel seen on-end incidentally noted over the right hilar region. Heart size cannot be evaluated. No acute osseous abnormality is identified.  IMPRESSION: New near total opacification of the left hemi thorax reflecting large pleural effusion and associated compressive atelectasis. The underlying lung is not visualized with the exception of minimally residually aerated left upper lobe.   Electronically Signed   By: Christiana Pellant M.D.   On: 05/02/2013 20:31   Dg Chest Port 1v Same Day  05/22/2013   CLINICAL DATA:  Status post thoracentesis.  Assess for pneumothorax.  EXAM: PORTABLE CHEST - 1 VIEW SAME DAY  COMPARISON:  Chest radiograph performed 05/17/2013  FINDINGS: There has been mild interval decrease in the size of the patient's large left-sided pleural effusion. Underlying airspace opacification is again seen. No definite pneumothorax is identified.  Vague hazy opacity is seen within the right lung, which may reflect mild pneumonia.  The cardiomediastinal silhouette is not well assessed but likely normal in size. No acute osseous abnormalities are seen.  IMPRESSION: 1. No definite evidence of pneumothorax. 2. Mild interval decrease in size of large left-sided pleural effusion, though a large effusion is still present. Underlying airspace opacification is better characterized status post thoracentesis. Vague hazy airspace opacity within the right lung raises concern for pneumonia.   Electronically Signed   By: Roanna Raider M.D.   On: 05/22/2013 06:02    THORACENTESIS FLUID 05/22/13>> 1400cc of bloody fluid removed TProt> looks like this was not done LDH> 210 Gluc> 79 PH> 8.00 Cell ct & diff> 1000 wbc w/ 59 neutro, 13 lymph, 28 mono... C&S>  pending Cyto> pending   ASSESSMENT / PLAN: Principal Problem:   Pleural effusion Active Problems:   Hypertension   Anemia of other chronic disease   Hypoxia  1. Hypoxemic Respiratory Failure in the Setting of Pleural Effusion: The DDx includes empyema/parapneumonic effusion vs. Malignant effusion. There was no frank pus in the thoracentesis, studies pending.   Agree with emperic HCAP  Coverage  Add Azithro  Follow-up pleural effusion studies   Brendaliz Kuk M. Kriste BasqueNadel, MD Pulmonary and Critical Care Medicine [facility] Pager: [phone]  05/23/2013, 11:25 AM

## 2013-05-24 DIAGNOSIS — R0609 Other forms of dyspnea: Secondary | ICD-10-CM

## 2013-05-24 DIAGNOSIS — Z515 Encounter for palliative care: Secondary | ICD-10-CM

## 2013-05-24 DIAGNOSIS — R0989 Other specified symptoms and signs involving the circulatory and respiratory systems: Secondary | ICD-10-CM

## 2013-05-24 MED ORDER — MORPHINE SULFATE (CONCENTRATE) 10 MG /0.5 ML PO SOLN: 5.0000 mg | ORAL | Status: DC | PRN

## 2013-05-24 MED ORDER — MORPHINE SULFATE (CONCENTRATE) 10 MG /0.5 ML PO SOLN
5.0000 mg | Freq: Four times a day (QID) | ORAL | Status: DC
  Administered 2013-05-24: 5 mg via ORAL
  Filled 2013-05-24: qty 0.5

## 2013-05-24 MED ORDER — BISACODYL 10 MG RE SUPP: 10.0000 mg | Freq: Every day | RECTAL | Status: DC | PRN

## 2013-05-24 MED ORDER — LORAZEPAM 1 MG PO TABS
1.0000 mg | ORAL_TABLET | ORAL | Status: DC | PRN
  Administered 2013-05-24: 1 mg via ORAL
  Filled 2013-05-24: qty 1

## 2013-05-24 MED ORDER — SCOPOLAMINE 1 MG/3DAYS TD PT72
1.0000 | MEDICATED_PATCH | TRANSDERMAL | Status: DC
  Administered 2013-05-24: 1.5 mg via TRANSDERMAL
  Filled 2013-05-24: qty 1

## 2013-05-25 DIAGNOSIS — R0600 Dyspnea, unspecified: Secondary | ICD-10-CM

## 2013-05-25 DIAGNOSIS — Z515 Encounter for palliative care: Secondary | ICD-10-CM

## 2013-05-25 LAB — BODY FLUID CULTURE: Culture: NO GROWTH

## 2013-05-25 LAB — URINE CULTURE: Colony Count: >=100000

## 2013-05-28 LAB — CULTURE, BLOOD (ROUTINE X 2)
CULTURE: NO GROWTH
Culture: NO GROWTH

## 2013-05-30 NOTE — Progress Notes (Addendum)
Pt has cried all night, praying for God to take her.  Her bp is too low to receive any sedative. Pt is inconsolable at this point.  Will report to oncoming RN and  MDs on AM rounds.

## 2013-05-30 NOTE — Progress Notes (Signed)
PULMONARY  / CRITICAL CARE MEDICINE CONSULTATION  Name: Kara [name] MRN: [id number] DOB: [birth year]    ADMISSION DATE:  05/29/2013  CHIEF COMPLAINT:  Hypoxemia  BRIEF PATIENT DESCRIPTION: 80 yo F with DM, Dementia who was transferred from NH at son's request for evaluation of lethargy and was found to be hypoxemic in setting of large left pleural effusion. PCCM consulted for pleural effusion evaluation.  SIGNIFICANT EVENTS / STUDIES:  1. Thoracentesis 05/22/2013  LINES / TUBES: 1. PIV  CULTURES: 1. Blood Cultures x 2 1/23>> 2. Urine Culture 1/23>>entrococcus, staph a>> 3. Sputum Culture 1/23>> 4. Pleural Fluid Culture 1/23>:>   ANTIBIOTICS: 1. Vanc 1/23->> 2. Cefepime 1/23->> 3. Azithro 1/23->>  HISTORY OF PRESENT ILLNESS:  Kara [name] is a 80 yo F with DM, and dementia who was transferred from her nursing home today in the setting of hypoxemia. The following is obtained from a chart review and from the patient's son, [name] [phone]), as the patient is unable to provide much history. She has had decreased activity and a cough x 2 weeks.   Sched MEDS:  . aspirin EC  81 mg Oral Daily  . azithromycin  250 mg Intravenous Q24H  . ceFEPime (MAXIPIME) IV  1 g Intravenous Q24H  . divalproex  250 mg Oral BID  . docusate sodium  100 mg Oral BID  . hydrocortisone-pramoxine  1 applicator Rectal TID  . iron polysaccharides  150 mg Oral Q M,W,F  . lactose free nutrition  237 mL Oral BID  . polyethylene glycol  17 g Oral Daily  . QUEtiapine  25 mg Oral Daily  . sodium chloride  3 mL Intravenous Q12H  . sodium chloride  3 mL Intravenous Q12H  . vancomycin  750 mg Intravenous Q48H    PHYSICAL EXAM  VITAL SIGNS: Temp:  [97.4 F (36.3 C)-98 F (36.7 C)] 97.5 F (36.4 C) (01/26 0641) Pulse Rate:  [65-99] 99 (01/26 0641) Resp:  [16-24] 22 (01/26 0641) BP: (58-88)/(28-48) 88/43 mmHg (01/26 0641) SpO2:  [92 %-99 %] 92 % (01/26 0641)  HEMODYNAMICS:    VENTILATOR  SETTINGS:    INTAKE / OUTPUT: Intake/Output     01/25 0701 - 01/26 0700 01/26 0701 - 01/27 0700   P.O. 440    I.V. (mL/kg) 3 (0.1)    IV Piggyback 825    Total Intake(mL/kg) 1268 (28)    Total Output 0     Net +1268          Urine Occurrence 5 x 1 x   Stool Occurrence 1 x 2 x     PHYSICAL EXAMINATION: General:  Elderly F in NAD Neuro: A+Ox1, responds to some questions, contracted HEENT:  Sclera anicteric, conjunctiva pink, MMM Neck:  Trachea supple and midline, (-) LAN Cardiovascular:  RRR, NS1/S2, (-) MRG Lungs:  Decreased Breath Sounds at Left Base Abdomen:  S/NT/ND/(+)BS Musculoskeletal:  (-) C/C/E Skin:  Decubitus dressing C/D/I  LABS:  CBC Recent Labs     2013/05/29  2025  05/22/13  0130  05/23/13  0601  WBC  6.9  6.3  14.1*  HGB  10.5*  9.7*  11.3*  HCT  33.5*  32.2*  37.1  PLT  337  322  190   Coag's No results found for this basename: APTT, INR,  in the last 72 hours  BMET Recent Labs     05/22/13  0130  05/22/13  1159  05/23/13  0601  NA  150*  148*  144  K  4.6  4.4  4.3  CL  113*  109  108  CO2  32  27  31  BUN  36*  35*  35*  CREATININE  1.02  0.91  1.08  GLUCOSE  93  232*  98    Electrolytes Recent Labs     05/22/13  0130  05/22/13  1159  05/23/13  0601  CALCIUM  8.4  8.3*  8.0*    Sepsis Markers No results found for this basename: LACTICACIDVEN, PROCALCITON, O2SATVEN,  in the last 72 hours  ABG Recent Labs     05/25/2013  2020  PHART  7.373  PCO2ART  54.9*  PO2ART  91.8    Liver Enzymes Recent Labs     05/20/2013  2025  AST  22  ALT  14  ALKPHOS  66  BILITOT  0.3  ALBUMIN  2.1*    Cardiac Enzymes Recent Labs     05/25/2013  2025  05/22/13  0130  TROPONINI  <0.30   --   PROBNP   --   719.8*    Glucose No results found for this basename: GLUCAP,  in the last 72 hours  Imaging No results found.  THORACENTESIS FLUID 05/22/13>> 1400cc of bloody fluid removed TProt> looks like this was not done LDH> 210 Gluc>  79 PH> 8.00 Cell ct & diff> 1000 wbc w/ 59 neutro, 13 lymph, 28 mono... C&S> pending Cyto> pending   ASSESSMENT / PLAN: Principal Problem:   Pleural effusion Active Problems:   Hypertension   Anemia of other chronic disease   Hypoxia  1. Hypoxemic Respiratory Failure in the Setting of Pleural Effusion: The DDx includes empyema/parapneumonic effusion vs. Malignant effusion. There was no frank pus in the thoracentesis, studies pending.   Agree with emperic HCAP Coverage  Add Azithro  Follow-up pleural effusion studies  PCCm will be available PRN   Brett CanalesSteve Minor ACNP [facility] Pager [phone] till 3 pm If no answer page [phone] June 07, 2013, 1:14 PM  Continue abx coverage as above.  Little else for PCCM to offer at this point.  Will sign off, please call back if needed.  Patient seen and examined, agree with above note.  I dictated the care and orders written for this patient under my direction.  Alyson ReedyWesam G Shemica Meath, MD [phone]

## 2013-05-30 NOTE — Progress Notes (Addendum)
Notified Kara ComberMilton Grulke, patient's son, of her death.  He was offered the opportunity to come to hospital and spend some time with his mother/say goodbye.  He states he will contact his siblings and they would like to spend some time with his mother before moving to the morgue.  Notified Administrative Coordinator Wells GuilesSarah Marshall, RN and Department Assistant Director Gayleen Oremhasity Hearn, RN.

## 2013-05-30 NOTE — Clinical Documentation Improvement (Addendum)
PLEASE NOTE IF PRESENT ON ADMISSION  Possible Clinical Conditions?   Stage  I  Pressure Ulcer   (reddening of the skin) Stage  II Pressure Ulcer  (blister open or unopened) Stage  III Pressure Ulcer (through all layers skin) Stage IV Pressure Ulcer   (through skin & underlying  muscle, tendons, and bones) Other Condition Cannot Clinically Determine   Supporting Information: ED note: 05/08/2013 "Two quarter sized stage II pressure ulcers above buttock One large 4cm x 4 cm tracking pressure ucler to right of gluteal cleft. " ED staff note:"Pt has two stage 2 pressure ulcers and one tunneling pressure ulcer to bottom" INITIAL NUTRITION ASSESSMENT: 05/23/13   Signs & Symptoms:  Generalized muscle weakness     Wound Nurse Assessment (WOC):     Thank Barrie DunkerYou, Cathy Crounse C Ardith Test ,RN Clinical Documentation Specialist:  [phone]  [facility]- Health Information Management

## 2013-05-30 NOTE — Progress Notes (Signed)
The patient was found expired at shift change 1925. Two nurses- Adela LankJacqueline Yaron Grasse RN and Erskine Emeryhomas Ryan RN verified no breath sounds, no pulse and no signs of life. Son - Floreen ComberMilton Coles was notified by the charge nurse - Laurann Montanaynthia White RN. Patient's body is to remain in the room until son (family) view the body per son request.

## 2013-05-30 NOTE — Progress Notes (Signed)
Patient ID: Kara [name], female   DOB: 1906-01-15, 80 y.o.   MRN: [id number]  [facility] PROGRESS NOTE  Kara [name] WUJ:[id number] DOB: 1905/09/24 DOA: 05/27/2013 PCP: Laurena Slimmer, MD  Brief narrative:  80 y.o. female history of hypertension, difficulty hearing, dementia and anemia was transferred from the nursing home after found to be hypoxic with shortness of breath. As per patient's son who provided most of the history on admission, patient has been having increasing cough with shortness of breath over the last 2 weeks. In the ER patient's chest x-ray shows complete opacification of the left chest from possible left pleural effusion. Patient has been empirically started on antibiotics for health care associated pneumonia, given the history of shortness of breath, productive cough and fever.   Principal Problem:  Pleural effusion  - unclear etiology at this time  - appreciate PCCM assistance  - Blood Cultures x 2, Urine Culture, Sputum Culture, Pleural Fluid Culture still pending  - pt is more agitated and lethargic on exam, will ask PCT for further assistance with GOC - considering full comfort by family members  - place on Morphine prn and may need scheduled morphine for better symptom control  Active Problems:  Hypernatremia  - most likely secondary to pre renal etiology  - encourage PO intake if pt able to tolerate  Hypertension  - on soft side this AM  - discontinued losartan  Anemia of other chronic disease  - no signs of active bleed  Severe malnutrition, BMP < 17 - secondary to progressive failure to throve, acute illness, deconditioning - PCT consult Functional quadriplegia  - secondary to acute on chronic illnesses outlined above   Consultants:  PCCM  Procedures/Studies:  Dg Chest Port 1 View 05/08/2013 New near total opacification of the left hemi thorax reflecting large pleural effusion and associated compressive atelectasis. The underlying lung is  not visualized with the exception of minimally residually aerated left upper lobe.  Dg Chest Port 1v Same Day 05/22/2013 No definite evidence of pneumothorax. Mild interval decrease in size of large left-sided pleural effusion, though a large effusion is still present. Underlying airspace opacification is better characterized status post thoracentesis. Vague hazy airspace opacity within the right lung raises concern for pneumonia.  Thoracentesis 01/24 by PCCM team Antibiotics:  Zithromax 01/23 -->  Maxipime 01/23 -->  Vancomycin 01/23 -->  Code Status: DNR  Family Communication: No family at bedside  Disposition Plan: PCT consult for GOC  HPI/Subjective: No events overnight.   Objective: Filed Vitals:   05/23/13 2342 May 28, 2013 0200 May 28, 2013 0545 05/28/13 0641  BP: 88/44 88/48 84/44  88/43  Pulse:  88 86 99  Temp:   97.4 F (36.3 C) 97.5 F (36.4 C)  TempSrc:    Axillary  Resp:  24 20 22   Height:      Weight:      SpO2:  99% 92% 92%    Intake/Output Summary (Last 24 hours) at May 28, 2013 1208 Last data filed at 28-May-2013 0549  Gross per 24 hour  Intake    945 ml  Output      0 ml  Net    945 ml    Exam:   General:  Pt is lethargic, agitated, pulling lines out, cachectic  Cardiovascular: Regular rate and rhythm, S1/S2, no murmurs, no rubs, no gallops  Respiratory: Rhonchi and gargling sounds bilaterally with mild tachypnea   Abdomen: Soft, non tender, non distended, bowel sounds present, no guarding  Extremities: No edema, pulses DP and PT  palpable bilaterally  Data Reviewed: Basic Metabolic Panel:  Recent Labs Lab Jul 22, 2013 2025 05/22/13 0130 05/22/13 1159 05/23/13 0601  NA 147 150* 148* 144  K 4.6 4.6 4.4 4.3  CL 110 113* 109 108  CO2 31 32 27 31  GLUCOSE 112* 93 232* 98  BUN 35* 36* 35* 35*  CREATININE 0.99 1.02 0.91 1.08  CALCIUM 8.5 8.4 8.3* 8.0*   Liver Function Tests:  Recent Labs Lab Jul 22, 2013 2025  AST 22  ALT 14  ALKPHOS 66  BILITOT 0.3   PROT 7.6  ALBUMIN 2.1*    Recent Labs Lab 05/22/13 0130  AMMONIA 22   CBC:  Recent Labs Lab Jul 22, 2013 2025 05/22/13 0130 05/23/13 0601  WBC 6.9 6.3 14.1*  NEUTROABS 5.8  --   --   HGB 10.5* 9.7* 11.3*  HCT 33.5* 32.2* 37.1  MCV 93.1 93.9 92.8  PLT 337 322 190   Cardiac Enzymes:  Recent Labs Lab Jul 22, 2013 2025  TROPONINI <0.30    Recent Results (from the past 240 hour(s))  CULTURE, BLOOD (ROUTINE X 2)     Status: None   Collection Time    Jul 22, 2013  8:25 PM      Result Value Range Status   Specimen Description BLOOD RIGHT HAND   Final   Special Requests BOTTLES DRAWN AEROBIC ONLY 2ML   Final   Culture  Setup Time     Final   Value: 05/22/2013 00:52     [facility]formed at [facility]   Culture     Final   Value:        BLOOD CULTURE RECEIVED NO GROWTH TO DATE CULTURE WILL BE HELD FOR 5 DAYS BEFORE ISSUING A FINAL NEGATIVE REPORT     Performed at [facility]   Report Status PENDING   Incomplete  CULTURE, BLOOD (ROUTINE X 2)     Status: None   Collection Time    Jul 22, 2013  8:28 PM      Result Value Range Status   Specimen Description BLOOD RIGHT FOREARM   Final   Special Requests BOTTLES DRAWN AEROBIC AND ANAEROBIC 3ML   Final   Culture  Setup Time     Final   Value: 05/22/2013 00:52     Performed at [facility]   Culture     Final   Value:        BLOOD CULTURE RECEIVED NO GROWTH TO DATE CULTURE WILL BE HELD FOR 5 DAYS BEFORE ISSUING A FINAL NEGATIVE REPORT     Performed at [facility]   Report Status PENDING   Incomplete  URINE CULTURE     Status: None   Collection Time    Jul 22, 2013  8:53 PM      Result Value Range Status   Specimen Description URINE, RANDOM   Final   Special Requests NONE   Final   Culture  Setup Time     Final   Value: 05/22/2013 00:53     Performed at [facility]   Colony Count     Final   Value: >=100,000 COLONIES/ML     Performed at [facility]   Culture     Final   Value:  ENTEROCOCCUS SPECIES     STAPHYLOCOCCUS AUREUS     Note: RIFAMPIN AND GENTAMICIN SHOULD NOT BE USED AS SINGLE DRUGS FOR TREATMENT OF STAPH INFECTIONS.     Performed at [facility]   Report Status PENDING   Incomplete  BODY FLUID CULTURE     Status: None   Collection Time    05/22/13  4:49 AM      Result Value Range Status   Specimen Description PLEURAL   Final   Special Requests NONE   Final   Gram Stain     Final   Value: RARE WBC PRESENT,BOTH PMN AND MONONUCLEAR     NO ORGANISMS SEEN     ANAEROBIC BLOOD AGAR PLATE     Performed at [facility]   Culture     Final   Value: NO GROWTH 2 DAYS     Performed at [facility]   Report Status PENDING   Incomplete     Scheduled Meds: . aspirin EC  81 mg Oral Daily  . azithromycin  250 mg Intravenous Q24H  . ceFEPime (MAXIPIME) IV  1 g Intravenous Q24H  . divalproex  250 mg Oral BID  . docusate sodium  100 mg Oral BID  . hydrocortisone-pramoxine  1 applicator Rectal TID  . iron polysaccharides  150 mg Oral Q M,W,F  . lactose free nutrition  237 mL Oral BID  . polyethylene glycol  17 g Oral Daily  . QUEtiapine  25 mg Oral Daily  . sodium chloride  3 mL Intravenous Q12H  . sodium chloride  3 mL Intravenous Q12H  . vancomycin  750 mg Intravenous Q48H   Continuous Infusions:   Debbora Presto, MD  [facility] Pager [phone]  If 7PM-7AM, please contact night-coverage [email or web address] Password [email or web address] 05/19/2013, 12:08 PM   LOS: 3 days

## 2013-05-30 NOTE — Progress Notes (Signed)
RN paged NP secondary to pt expiring at 1925. Pronounced by 2 RNs. Pt was a DNR and death was anticipated. Death certificate completed. No family at bedside.  Jimmye NormanKaren Kirby-Graham, NP [facility]

## 2013-05-30 NOTE — Consult Note (Signed)
Patient ZO:[id number]:Kara [name]      DOB: Aug 24, 1905      JWJ:[id number]     Consult Note from the Palliative Medicine Team at [facility]    Consult Requested by: Dr Izola PriceMyers     PCP: Laurena SlimmerLARK,PRESTON S, MD Reason for Consultation:Clarification of GOC and options     Phone Number:[phone]  Assessment of patients Current state: 80 years old women admitted with increased SOB and cough.  Past several months in setting of overall failure to thrive.  Now with pleural effusion/thoracentesis with 1400 cc fluid remove. Generally uncomfortable, agitated.   Family now faced with advanced care decsions and anticipatroy care needs.   Consult is for review of medical treatment options, clarification of goals of care and end of life issues, disposition and options, and symptom recommendation.  This NP Lorinda CreedMary Kathleen Likins reviewed medical records, received report from team, assessed the patient and then meet at the patient's bedside along with her son/HPOA Floreen ComberMilton Netto to discuss diagnosis prognosis, GOC, EOL wishes disposition and options.   A detailed discussion was had today regarding advanced directives.  Concepts specific to code status, artifical feeding and hydration, continued IV antibiotics and rehospitalization was had.  The difference between a aggressive medical intervention path  and a palliative comfort care path for this patient at this time was had.  Values and goals of care important to patient and family were attempted to be elicited.  Concept of Hospice and Palliative Care were discussed  Natural trajectory and expectations at EOL were discussed.  Questions and concerns addressed.  Hard Choices booklet left for review. Family encouraged to call with questions or concerns.  PMT will continue to support holistically.   Goals of Care: 1.  Code Status: DNR/DNI   2. Scope of Treatment: 1. Vital Signs: daily  2. Respiratory/Oxygen:for comfort only 3. Nutritional Support/Tube Feeds: no artifical  feeding now or in the future 4. Antibiotics:none 5. Blood Products:none 6. ION:GEXBVF:none 7. Review of Medications to be discontinued:minimize for comfort 8. Labs: none 9. Telemetry:none 10. Consults:none   4. Disposition:Re-evaluate in the morning,family is hopeful for residential hospice.  The patients' son's [facility] died [facility] several years ago and he is hopeful that his mother will have the same experience at her EOL    3. Symptom Management:   1. Anxiety/Agitation: Ativan 1 mg po/sl every 4 hours prn 2. Pain/Dyspnea:         Roxanoly 5 mg SL every 6 hrs scheduled and every 2 hrs prn 3. Bowel Regimen: Dulcolax supp prn 4. Terminal Secretions: Scopolamine patch  4. Psychosocial: Emotional support offered to family at bedsdie   Patient Documents Completed or Given: Document Given Completed  Advanced Directives Pkt    MOST  yes  DNR    Gone from My Sight    Hard Choices      Brief HPI: Kara Rockslizabeth Bennis is a [age] y.o. female history of hypertension, difficulty hearing, dementia and anemia was transferred from the nursing home after patient was found to be hypoxic with shortness of breath. As per patient's son from whom I had obtained most of the history, patient has been having increasing cough with shortness of breath over the last 2 weeks. In the ER patient's chest x-ray shows complete opacification of the left chest from possible left pleural effusion. Patient at this time has been empirically started on antibiotics for health care associated pneumonia, given the history of shortness of breath or productive cough and fever. Patient's son feels  that over the last few months patient's dementia has further worsened. Patient did not have any nausea vomiting abdominal pain or diarrhea. Patient was admitted few months ago for falls and at that time was placed in nursing home after discharge and during which patient's diuretics was discontinued due to dehydration and falls.   Treated to  stabilize medically.    ROS: unable to illicit due to altered cognition    PMH:  Past Medical History  Diagnosis Date  . Dementia   . Hypertension   . Diabetes mellitus   . Anemia, unspecified   . Hearing loss   . Generalized muscle weakness      PSH: Past Surgical History  Procedure Laterality Date  . Hip fracture surgery     I have reviewed the FH and SH and  If appropriate update it with new information. No Known Allergies Scheduled Meds: . divalproex  250 mg Oral BID  . hydrocortisone-pramoxine  1 applicator Rectal TID  . lactose free nutrition  237 mL Oral BID  . QUEtiapine  25 mg Oral Daily  . sodium chloride  3 mL Intravenous Q12H  . sodium chloride  3 mL Intravenous Q12H   Continuous Infusions:  PRN Meds:.acetaminophen, acetaminophen, LORazepam, morphine CONCENTRATE, ondansetron    BP 86/49  Pulse 88  Temp(Src) 97 F (36.1 C) (Axillary)  Resp 22  Ht 4' 11.84" (1.52 m)  Wt 45.36 kg (100 lb)  BMI 19.63 kg/m2  SpO2 94%   PPS:20 %   Intake/Output Summary (Last 24 hours) at 05/15/2013 1511 Last data filed at 05/23/2013 0549  Gross per 24 hour  Intake    795 ml  Output      0 ml  Net    795 ml    Physical Exam:  General: chronically ill appearing , frail and cachetic HEENT:  Mm, no exudate Chest:   Decreased in bases, scattered coarse BS CVS: Tachycardic, rate 108 Abdomen: concave, NT +BS Ext:  Without edema Neuro: confused, unable to follow commands  Labs: CBC    Component Value Date/Time   WBC 14.1* 05/23/2013 0601   RBC 4.00 05/23/2013 0601   HGB 11.3* 05/23/2013 0601   HCT 37.1 05/23/2013 0601   PLT 190 05/23/2013 0601   MCV 92.8 05/23/2013 0601   MCH 28.3 05/23/2013 0601   MCHC 30.5 05/23/2013 0601   RDW 15.5 05/23/2013 0601   LYMPHSABS 0.7 Jun 15, 2013 2025   MONOABS 0.4 06-15-2013 2025   EOSABS 0.0 06/15/2013 2025   BASOSABS 0.0 06/15/2013 2025    BMET    Component Value Date/Time   NA 144 05/23/2013 0601   K 4.3 05/23/2013 0601   CL  108 05/23/2013 0601   CO2 31 05/23/2013 0601   GLUCOSE 98 05/23/2013 0601   BUN 35* 05/23/2013 0601   CREATININE 1.08 05/23/2013 0601   CALCIUM 8.0* 05/23/2013 0601   GFRNONAA 39* 05/23/2013 0601   GFRAA 45* 05/23/2013 0601    CMP     Component Value Date/Time   NA 144 05/23/2013 0601   K 4.3 05/23/2013 0601   CL 108 05/23/2013 0601   CO2 31 05/23/2013 0601   GLUCOSE 98 05/23/2013 0601   BUN 35* 05/23/2013 0601   CREATININE 1.08 05/23/2013 0601   CALCIUM 8.0* 05/23/2013 0601   PROT 7.6 06-15-2013 2025   ALBUMIN 2.1* 2013-06-15 2025   AST 22 15-Jun-2013 2025   ALT 14 Jun 15, 2013 2025   ALKPHOS 66 06-15-13 2025   BILITOT 0.3 2013/06/15 2025  GFRNONAA 39* 05/23/2013 0601   GFRAA 45* 05/23/2013 0601     Time In Time Out Total Time Spent with Patient Total Overall Time  1415 1530 70 min 75 min    Greater than 50%  of this time was spent counseling and coordinating care related to the above assessment and plan.  Lorinda Creed NP  Palliative Medicine Team Team Phone # [phone] Pager [phone]  Discussed with Dr Izola Price

## 2013-05-30 NOTE — Progress Notes (Signed)
Thank you for consulting the Palliative Medicine Team at [facility] to meet your patient's and family's needs.   The reason that you asked us to see your patient is  For Clarification of GOC and options  We have scheduled your patient for a meeting: Today Monday 05/03/2013 at 1420  The Surrogate decision make is:  Floreen ComberMilton Demore (son) Contact information: C# [phone]  Lorinda CreedMary Elaiza Shoberg NP  Palliative Medicine Team Team Phone # [phone] Pager [phone]

## 2013-05-30 NOTE — Progress Notes (Signed)
Clinical Social Work  CSW attempted to meet with patient but patient unable to participate in assessment. CSW called and spoke with son via phone. Son reports that patient has been staying at [facility] since October 2014 but reports that patient has used all Medicare days and he is working with Department of Social Services in order to apply for Medicaid for patient. Son reports he is working with PMT but wants to speak with CSW after GOC meeting. FL2 completed and placed on chart.CSW will continue to follow and complete full assessment after meeting with family.  Webster GrovesHolly Burnell Hurta, KentuckyLCSW [phone]

## 2013-05-30 NOTE — Progress Notes (Signed)
Clinical Social Work Department BRIEF PSYCHOSOCIAL ASSESSMENT 06/04/13  Patient:  Kara [name]     Account Number:  [id number]     Admit date:  05/06/2013  Clinical Social Worker:  Kara [name]  Date/Time:  06/04/2013 03:30 PM  Referred by:  Physician  Date Referred:  06/04/2013 Referred for  SNF Placement   Other Referral:   Interview type:  Family Other interview type:   Patient unable to participate in assessment.    PSYCHOSOCIAL DATA Living Status:  FACILITY Admitted from facility:  [name] Level of care:  Funk Primary support name:  Kara [name] Primary support relationship to patient:  CHILD, ADULT Degree of support available:   Strong    CURRENT CONCERNS Current Concerns  Post-Acute Placement   Other Concerns:    SOCIAL WORK ASSESSMENT / PLAN CSW received referral to assist with DC planning. CSW spoke with NP from PMT as well prior to meeting with family. CSW reviewed chart and met with patient, son, and dtr-in-law. CSW introduced myself and explained role.    [name]. Patient's Medicare stopped paying in the beginning of January and patient's secondary insurance has paid up until 05/20/13. Patient's son is working with Medicaid in order to get application approved. Son reports that he is working with PMT but depending on patient's progress, son feels that patient might need to go to [name]. Son has had other family members at [name]. CSW explained that patient would have to meet criteria and that it would still depend if [name] had available space. CSW explained if [name] not have any beds then family could choose another hospice facility or SNF placement. Son reports he is hopeful for [name] but is aware of plans.    CSW will continue to follow.   Assessment/plan status:  Psychosocial Support/Ongoing Assessment of Needs Other assessment/ plan:    Information/referral to community resources:   SNF vs Hospice placement    PATIENT'S/FAMILY'S RESPONSE TO PLAN OF CARE: Patient unable to participate in assessment. Son and dtr-in-law very involved and agreeable to CSW assistance. Son is worried about patient and is unsure of DC plans at this time. Son reports that patient has always been independent and is aware that she is sick at this time. Son asked for CSW to continue to follow and allow him and wife to discuss plans tonight. Son has CSW contact information.       Cambrian [name], Emery [phone]

## 2013-05-30 DEATH — deceased

## 2013-06-01 NOTE — Discharge Summary (Signed)
  Death Summary  Kara [name] ZOX:[id number] DOB: 02-Sep-1905 DOA: 10/16/13  PCP: Laurena SlimmerLARK,PRESTON S, MD PCP/Office notified:  Admit date: 10/16/13 Date of Death: 04/30/2013 at 19:25   Final Diagnoses:  [age] y.o. female history of hypertension, difficulty hearing, dementia and anemia was transferred from the nursing home after found to be hypoxic with shortness of breath. As per patient's son who provided most of the history on admission, patient has been having increasing cough with shortness of breath over the last 2 weeks. In the ER patient's chest x-ray shows complete opacification of the left chest from possible left pleural effusion. Patient has been empirically started on antibiotics for health care associated pneumonia, given the history of shortness of breath, productive cough and fever.   Principal Problem:  Pleural effusion  - unclear etiology - Blood Cultures x 2, Urine Culture, Sputum Culture, Pleural Fluid Culture ordered but due to pt's distress and agitation, decision made to ensure full comfort  - pt is more agitated and lethargic on exam, PCT assisted with ensuring full comfort - placed on Morphine drip and pt expired 19:25 05/06/2013  Active Problems:  Hypernatremia  - most likely secondary to pre renal etiology  - encourage PO intake but pt unresponsive and with no oral intake over 48 hours  Hypertension  - on soft side  Anemia of other chronic disease  - no signs of active bleed  Severe malnutrition, BMP < 17  - secondary to progressive failure to throve, acute illness, deconditioning  Functional quadriplegia  - secondary to acute on chronic illnesses outlined above   Consultants:  PCCM  PCT Procedures/Studies:  Dg Chest Port 1 View 10/16/13 New near total opacification of the left hemi thorax reflecting large pleural effusion and associated compressive atelectasis. The underlying lung is not visualized with the exception of minimally residually aerated left  upper lobe.  Dg Chest Port 1v Same Day 05/22/2013 No definite evidence of pneumothorax. Mild interval decrease in size of large left-sided pleural effusion, though a large effusion is still present. Underlying airspace opacification is better characterized status post thoracentesis. Vague hazy airspace opacity within the right lung raises concern for pneumonia.  Thoracentesis 01/24 by PCCM team Antibiotics:  Zithromax 01/23 -->  Maxipime 01/23 -->  Vancomycin 01/23 -->  Code Status: DNR   Signed:  Debbora PrestoMAGICK-[name]  [facility] 06/01/2013, 10:05 PM  Debbora PrestoMAGICK-[name], MD  [facility] Pager [phone]  If 7PM-7AM, please contact night-coverage [email or web address] Password [email or web address]

## 2013-06-11 NOTE — Progress Notes (Addendum)
Patient ID: Kara [name], female   DOB: 04/11/1906, [age] y.o.   MRN: [id number]                PROGRESS NOTE  DATE:  11-28-2013    [facility]    LEVEL OF CARE:   SNF   Acute Visit   CHIEF COMPLAINT:  Cold symptoms.    HISTORY OF PRESENT ILLNESS:  I was asked to come see this patient by her son earlier this afternoon and later by the staff.  I have finally made it to see her.    She  apparently has been noted to have congestion and cough.  Nevertheless, her vital signs were reported to be stable on two separate occasions by the staff.  The patient has advanced dementia and I think is essentially bedbound secondary to this.  She is weak and deconditioned.    I do not see any recent lab work.    PHYSICAL EXAMINATION:   VITAL SIGNS:   RESPIRATIONS:  40.   O2 SATURATIONS:  75%.   PULSE:  120.   BLOOD PRESSURE:  I do not have her blood pressure.   GENERAL APPEARANCE:  Frail, elderly woman.   CHEST/RESPIRATORY:  There is no air entry in the left lower lobe.  However, there is a scoliosis to that side.  Right lung:  Decreased air entry with crackles.   CARDIOVASCULAR:  CARDIAC:   Heart sounds are tachycardic.  She does not appear to be grossly dehydrated.   GASTROINTESTINAL:   HERNIA:  Large central hernia.   ABDOMEN:  No tenderness.    ASSESSMENT/PLAN:  Bilateral pneumonia, probably left greater than right.  I suspect aspiration.  She is hypoxic, tachypneic, and tachycardic.  She requires transfer to the hospital for aggressive care.    I did talk to her son, who is the one who came to the other part of the building where I was working 2-3 hours ago insisting that I see her.  I did not sense any lack of aggressiveness towards this woman's care in spite of her dementia and advanced age.  Therefore, hospitalization is indicated.    I should note that in spite of the fact that I was told on two occasions that this woman was stable, when I arrived in the room she was  clearly unstable and this prompted the transfer to hospital.    CPT CODE: [id number]

## 2014-08-10 IMAGING — CR DG CHEST 1V PORT SAME DAY
1 series · 1 of 1 positions shown · non-contrast
Comparison: Chest radiograph performed 05/21/2013

CLINICAL DATA: Status post thoracentesis.  Assess for pneumothorax.

EXAM:
PORTABLE CHEST - 1 VIEW SAME DAY

[AP]
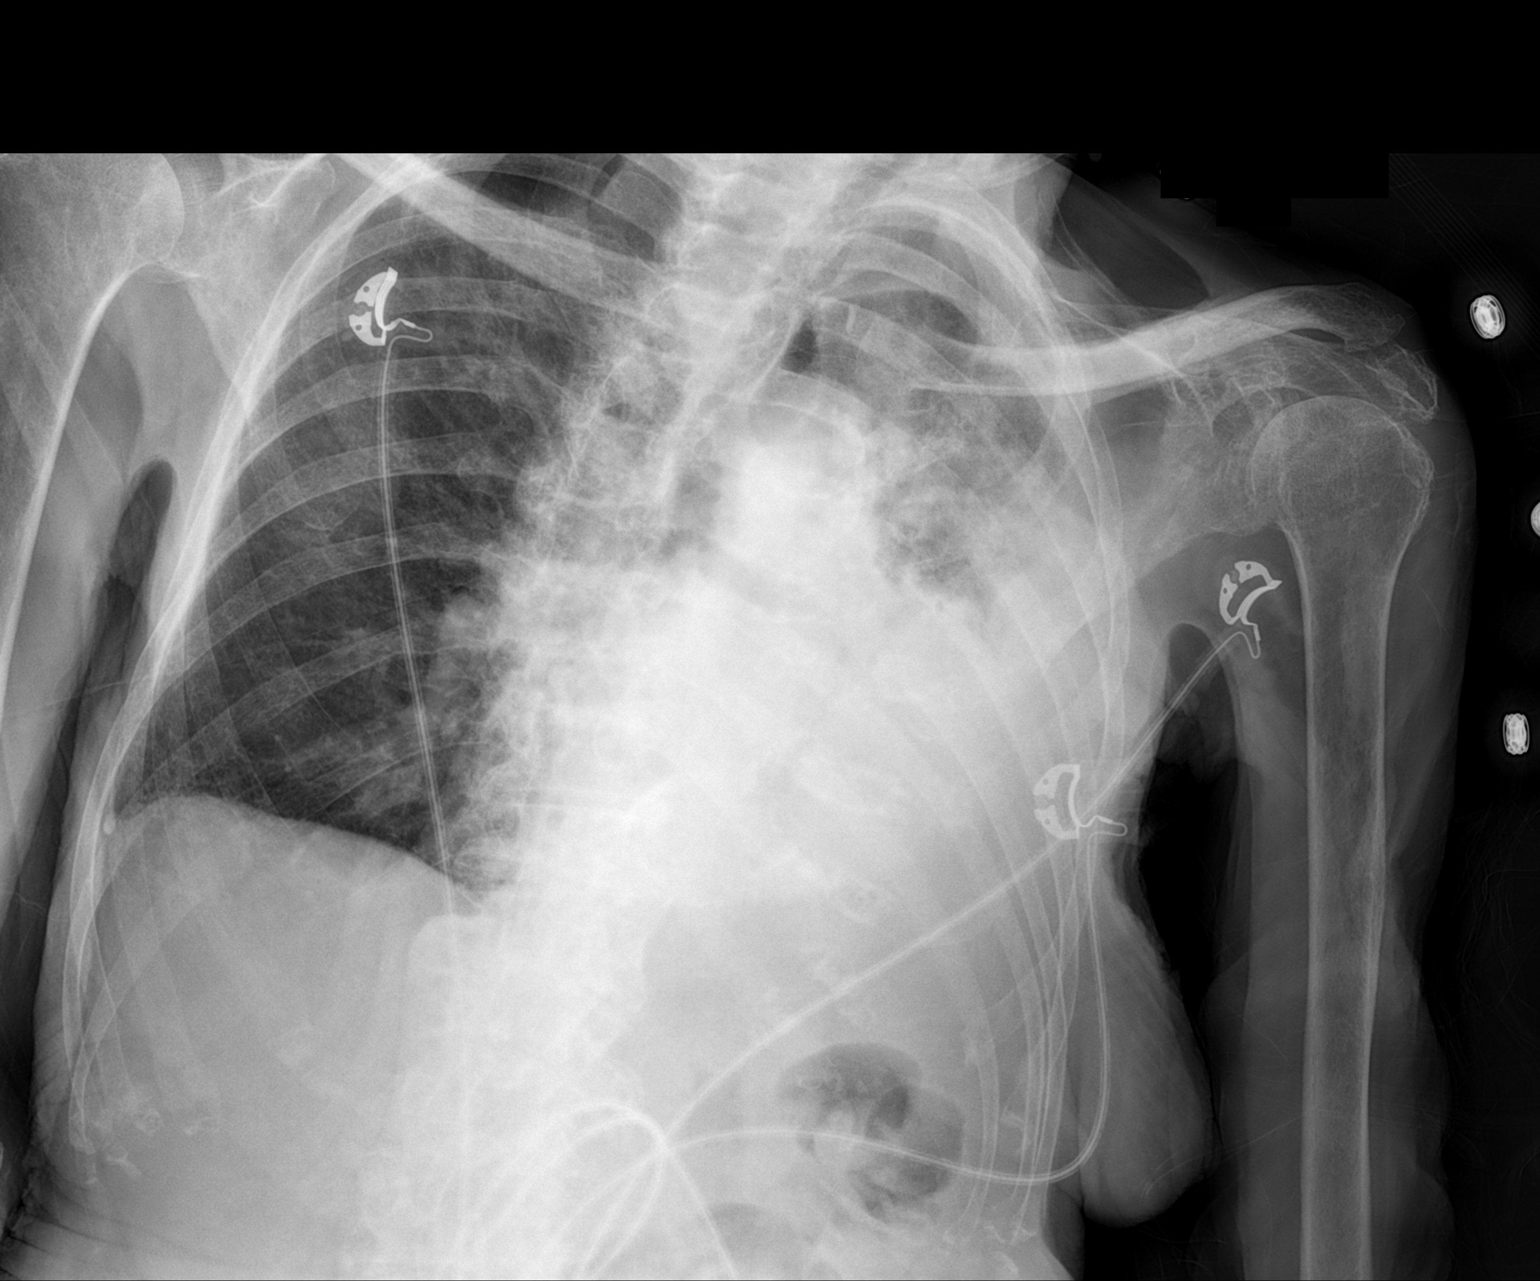

[1 of 1 positions shown; findings below may reference images not displayed]

FINDINGS: There has been mild interval decrease in the size of the patient's
large left-sided pleural effusion. Underlying airspace opacification
is again seen. No definite pneumothorax is identified.

Vague hazy opacity is seen within the right lung, which may reflect
mild pneumonia.

The cardiomediastinal silhouette is not well assessed but likely
normal in size. No acute osseous abnormalities are seen.
IMPRESSION: 1. No definite evidence of pneumothorax.
2. Mild interval decrease in size of large left-sided pleural
effusion, though a large effusion is still present. Underlying
airspace opacification is better characterized status post
thoracentesis. Vague hazy airspace opacity within the right lung
raises concern for pneumonia.
